# Patient Record
Sex: Female | Born: 1935 | Race: White | Hispanic: No | Smoking: Never smoker
Health system: Southern US, Community
[De-identification: ages and names within clinical notes are randomized; demographics above are authoritative.]

## PROBLEM LIST (undated history)

## (undated) DIAGNOSIS — M810 Age-related osteoporosis without current pathological fracture: Secondary | ICD-10-CM

## (undated) DIAGNOSIS — I4891 Unspecified atrial fibrillation: Secondary | ICD-10-CM

## (undated) DIAGNOSIS — G629 Polyneuropathy, unspecified: Secondary | ICD-10-CM

## (undated) DIAGNOSIS — M40209 Unspecified kyphosis, site unspecified: Secondary | ICD-10-CM

## (undated) DIAGNOSIS — C801 Malignant (primary) neoplasm, unspecified: Secondary | ICD-10-CM

## (undated) DIAGNOSIS — K219 Gastro-esophageal reflux disease without esophagitis: Secondary | ICD-10-CM

## (undated) HISTORY — PX: STOMACH SURGERY: SHX791

## (undated) HISTORY — PX: BILATERAL TOTAL MASTECTOMY WITH AXILLARY LYMPH NODE DISSECTION: SHX6364

---

## 2013-01-17 ENCOUNTER — Inpatient Hospital Stay: Payer: Self-pay | Admitting: Internal Medicine

## 2013-01-17 LAB — COMPREHENSIVE METABOLIC PANEL
ALBUMIN: 3.2 g/dL — AB (ref 3.4–5.0)
ALK PHOS: 87 U/L
Anion Gap: 5 — ABNORMAL LOW (ref 7–16)
BUN: 28 mg/dL — ABNORMAL HIGH (ref 7–18)
Bilirubin,Total: 0.4 mg/dL (ref 0.2–1.0)
CHLORIDE: 108 mmol/L — AB (ref 98–107)
CREATININE: 0.98 mg/dL (ref 0.60–1.30)
Calcium, Total: 8.6 mg/dL (ref 8.5–10.1)
Co2: 27 mmol/L (ref 21–32)
EGFR (Non-African Amer.): 52 — ABNORMAL LOW
GLUCOSE: 140 mg/dL — AB (ref 65–99)
OSMOLALITY: 287 (ref 275–301)
POTASSIUM: 3.6 mmol/L (ref 3.5–5.1)
SGOT(AST): 22 U/L (ref 15–37)
SGPT (ALT): 20 U/L (ref 12–78)
Sodium: 140 mmol/L (ref 136–145)
Total Protein: 6.9 g/dL (ref 6.4–8.2)

## 2013-01-17 LAB — URINALYSIS, COMPLETE
BLOOD: NEGATIVE
Bacteria: NONE SEEN
Bilirubin,UR: NEGATIVE
GLUCOSE, UR: NEGATIVE mg/dL (ref 0–75)
Ketone: NEGATIVE
Leukocyte Esterase: NEGATIVE
Nitrite: NEGATIVE
Ph: 5 (ref 4.5–8.0)
Protein: NEGATIVE
Specific Gravity: 1.016 (ref 1.003–1.030)
Squamous Epithelial: NONE SEEN

## 2013-01-17 LAB — CK TOTAL AND CKMB (NOT AT ARMC)
CK, TOTAL: 123 U/L (ref 21–215)
CK-MB: 2.7 ng/mL (ref 0.5–3.6)

## 2013-01-17 LAB — CBC
HCT: 37.7 % (ref 35.0–47.0)
HGB: 12.3 g/dL (ref 12.0–16.0)
MCH: 28.9 pg (ref 26.0–34.0)
MCHC: 32.7 g/dL (ref 32.0–36.0)
MCV: 88 fL (ref 80–100)
Platelet: 231 10*3/uL (ref 150–440)
RBC: 4.28 10*6/uL (ref 3.80–5.20)
RDW: 14.6 % — ABNORMAL HIGH (ref 11.5–14.5)
WBC: 11.5 10*3/uL — AB (ref 3.6–11.0)

## 2013-01-17 LAB — AMMONIA: Ammonia, Plasma: <10 mcmol/L (ref 11–32)

## 2013-01-17 LAB — LIPASE, BLOOD: Lipase: 64 U/L — ABNORMAL LOW (ref 73–393)

## 2013-01-17 LAB — RAPID INFLUENZA A&B ANTIGENS

## 2013-01-17 LAB — TROPONIN I: TROPONIN-I: 0.04 ng/mL

## 2013-01-18 LAB — BASIC METABOLIC PANEL
Anion Gap: 6 — ABNORMAL LOW (ref 7–16)
BUN: 18 mg/dL (ref 7–18)
CHLORIDE: 113 mmol/L — AB (ref 98–107)
CO2: 22 mmol/L (ref 21–32)
Calcium, Total: 8.1 mg/dL — ABNORMAL LOW (ref 8.5–10.1)
Creatinine: 0.7 mg/dL (ref 0.60–1.30)
EGFR (African American): >60
EGFR (Non-African Amer.): >60
GLUCOSE: 82 mg/dL (ref 65–99)
Osmolality: 282 (ref 275–301)
Potassium: 3.4 mmol/L — ABNORMAL LOW (ref 3.5–5.1)
Sodium: 141 mmol/L (ref 136–145)

## 2013-01-18 LAB — CBC WITH DIFFERENTIAL/PLATELET
Basophil #: 0 10*3/uL (ref 0.0–0.1)
Basophil %: 0.4 %
Eosinophil #: 0.2 10*3/uL (ref 0.0–0.7)
Eosinophil %: 2.1 %
HCT: 28.7 % — ABNORMAL LOW (ref 35.0–47.0)
HGB: 9.7 g/dL — ABNORMAL LOW (ref 12.0–16.0)
LYMPHS PCT: 26 %
Lymphocyte #: 1.9 10*3/uL (ref 1.0–3.6)
MCH: 29.4 pg (ref 26.0–34.0)
MCHC: 33.6 g/dL (ref 32.0–36.0)
MCV: 88 fL (ref 80–100)
Monocyte #: 0.9 x10 3/mm (ref 0.2–0.9)
Monocyte %: 12.6 %
NEUTROS ABS: 4.3 10*3/uL (ref 1.4–6.5)
Neutrophil %: 58.9 %
Platelet: 188 10*3/uL (ref 150–440)
RBC: 3.28 10*6/uL — ABNORMAL LOW (ref 3.80–5.20)
RDW: 15 % — AB (ref 11.5–14.5)
WBC: 7.3 10*3/uL (ref 3.6–11.0)

## 2013-01-18 LAB — WBCS, STOOL

## 2013-01-19 LAB — BASIC METABOLIC PANEL
Anion Gap: 3 — ABNORMAL LOW (ref 7–16)
BUN: 8 mg/dL (ref 7–18)
CALCIUM: 8.8 mg/dL (ref 8.5–10.1)
Chloride: 115 mmol/L — ABNORMAL HIGH (ref 98–107)
Co2: 23 mmol/L (ref 21–32)
Creatinine: 0.65 mg/dL (ref 0.60–1.30)
GLUCOSE: 101 mg/dL — AB (ref 65–99)
Osmolality: 280 (ref 275–301)
Potassium: 3.8 mmol/L (ref 3.5–5.1)
Sodium: 141 mmol/L (ref 136–145)

## 2013-01-19 LAB — MAGNESIUM: MAGNESIUM: 1.8 mg/dL

## 2013-01-20 LAB — CBC WITH DIFFERENTIAL/PLATELET
BASOS ABS: 0 10*3/uL (ref 0.0–0.1)
Basophil %: 0.6 %
Eosinophil #: 0.3 10*3/uL (ref 0.0–0.7)
Eosinophil %: 4 %
HCT: 32.1 % — AB (ref 35.0–47.0)
HGB: 10.6 g/dL — ABNORMAL LOW (ref 12.0–16.0)
LYMPHS PCT: 34.8 %
Lymphocyte #: 2.6 10*3/uL (ref 1.0–3.6)
MCH: 28.6 pg (ref 26.0–34.0)
MCHC: 33.1 g/dL (ref 32.0–36.0)
MCV: 87 fL (ref 80–100)
MONO ABS: 0.6 x10 3/mm (ref 0.2–0.9)
MONOS PCT: 8.2 %
NEUTROS ABS: 3.9 10*3/uL (ref 1.4–6.5)
Neutrophil %: 52.4 %
Platelet: 211 10*3/uL (ref 150–440)
RBC: 3.71 10*6/uL — AB (ref 3.80–5.20)
RDW: 15.1 % — ABNORMAL HIGH (ref 11.5–14.5)
WBC: 7.5 10*3/uL (ref 3.6–11.0)

## 2013-01-20 LAB — MAGNESIUM: Magnesium: 1.8 mg/dL

## 2013-01-20 LAB — POTASSIUM: Potassium: 3.7 mmol/L

## 2013-01-21 LAB — POTASSIUM: Potassium: 4 mmol/L (ref 3.5–5.1)

## 2013-01-21 LAB — MAGNESIUM: Magnesium: 2 mg/dL

## 2013-01-21 LAB — STOOL CULTURE

## 2013-01-22 LAB — CBC WITH DIFFERENTIAL/PLATELET
BASOS ABS: 0.1 10*3/uL (ref 0.0–0.1)
Basophil %: 1 %
EOS ABS: 0.5 10*3/uL (ref 0.0–0.7)
Eosinophil %: 5.2 %
HCT: 36 % (ref 35.0–47.0)
HGB: 11.8 g/dL — AB (ref 12.0–16.0)
LYMPHS ABS: 4.3 10*3/uL — AB (ref 1.0–3.6)
Lymphocyte %: 48.9 %
MCH: 28.2 pg (ref 26.0–34.0)
MCHC: 32.7 g/dL (ref 32.0–36.0)
MCV: 86 fL (ref 80–100)
MONOS PCT: 9.7 %
Monocyte #: 0.8 x10 3/mm (ref 0.2–0.9)
NEUTROS ABS: 3.1 10*3/uL (ref 1.4–6.5)
Neutrophil %: 35.2 %
Platelet: 233 10*3/uL (ref 150–440)
RBC: 4.18 10*6/uL (ref 3.80–5.20)
RDW: 14.9 % — AB (ref 11.5–14.5)
WBC: 8.7 10*3/uL (ref 3.6–11.0)

## 2013-01-22 LAB — MAGNESIUM: Magnesium: 1.7 mg/dL — ABNORMAL LOW

## 2013-01-22 LAB — POTASSIUM: POTASSIUM: 3.8 mmol/L (ref 3.5–5.1)

## 2013-01-23 LAB — CREATININE, SERUM
Creatinine: 0.76 mg/dL (ref 0.60–1.30)
EGFR (African American): >60
EGFR (Non-African Amer.): >60

## 2013-01-23 LAB — MAGNESIUM: Magnesium: 2.2 mg/dL

## 2013-01-23 LAB — POTASSIUM: Potassium: 3.9 mmol/L (ref 3.5–5.1)

## 2013-01-24 LAB — CBC WITH DIFFERENTIAL/PLATELET
Basophil #: 0.1 10*3/uL (ref 0.0–0.1)
Basophil %: 0.9 %
Eosinophil #: 0.3 10*3/uL (ref 0.0–0.7)
Eosinophil %: 3.4 %
HCT: 34 % — ABNORMAL LOW (ref 35.0–47.0)
HGB: 11.5 g/dL — ABNORMAL LOW (ref 12.0–16.0)
Lymphocyte #: 3.9 10*3/uL — ABNORMAL HIGH (ref 1.0–3.6)
Lymphocyte %: 45.4 %
MCH: 29.7 pg (ref 26.0–34.0)
MCHC: 33.8 g/dL (ref 32.0–36.0)
MCV: 88 fL (ref 80–100)
Monocyte #: 0.6 x10 3/mm (ref 0.2–0.9)
Monocyte %: 7.1 %
Neutrophil #: 3.7 10*3/uL (ref 1.4–6.5)
Neutrophil %: 43.2 %
PLATELETS: 272 10*3/uL (ref 150–440)
RBC: 3.87 10*6/uL (ref 3.80–5.20)
RDW: 14.8 % — AB (ref 11.5–14.5)
WBC: 8.5 10*3/uL (ref 3.6–11.0)

## 2013-01-24 LAB — BASIC METABOLIC PANEL
Anion Gap: 5 — ABNORMAL LOW (ref 7–16)
BUN: 10 mg/dL (ref 7–18)
CHLORIDE: 106 mmol/L (ref 98–107)
CO2: 27 mmol/L (ref 21–32)
Calcium, Total: 8.8 mg/dL (ref 8.5–10.1)
Creatinine: 0.71 mg/dL (ref 0.60–1.30)
EGFR (African American): >60
GLUCOSE: 132 mg/dL — AB (ref 65–99)
OSMOLALITY: 277 (ref 275–301)
Potassium: 3.5 mmol/L (ref 3.5–5.1)
SODIUM: 138 mmol/L (ref 136–145)

## 2013-01-24 LAB — CLOSTRIDIUM DIFFICILE(ARMC)

## 2014-04-12 ENCOUNTER — Emergency Department
Admit: 2014-04-12 | Disposition: A | Discharge status: Skilled Nursing Facility | Payer: Self-pay | Admitting: Emergency Medicine

## 2014-04-12 LAB — BASIC METABOLIC PANEL
Anion Gap: 4 — ABNORMAL LOW (ref 7–16)
BUN: 18 mg/dL
CO2: 31 mmol/L
CREATININE: 0.8 mg/dL
Calcium, Total: 8.5 mg/dL — ABNORMAL LOW
Chloride: 101 mmol/L
EGFR (Non-African Amer.): >60
Glucose: 110 mg/dL — ABNORMAL HIGH
POTASSIUM: 4.2 mmol/L
Sodium: 136 mmol/L

## 2014-04-12 LAB — CBC
HCT: 37.1 % (ref 35.0–47.0)
HGB: 11.7 g/dL — AB (ref 12.0–16.0)
MCH: 28.9 pg (ref 26.0–34.0)
MCHC: 31.7 g/dL — AB (ref 32.0–36.0)
MCV: 91 fL (ref 80–100)
Platelet: 210 10*3/uL (ref 150–440)
RBC: 4.06 10*6/uL (ref 3.80–5.20)
RDW: 15.9 % — AB (ref 11.5–14.5)
WBC: 12.5 10*3/uL — ABNORMAL HIGH (ref 3.6–11.0)

## 2014-04-12 LAB — TROPONIN I: Troponin-I: 0.03 ng/mL

## 2014-04-12 LAB — PRO B NATRIURETIC PEPTIDE: B-Type Natriuretic Peptide: 395 pg/mL — ABNORMAL HIGH

## 2014-04-17 LAB — CULTURE, BLOOD (SINGLE)

## 2014-04-28 NOTE — Discharge Summary (Signed)
ADDENDUM  PATIENT NAME:  Stephanie Mosley, Stephanie Mosley MR#:  [id number] DATE OF BIRTH:  August 02, 1924  DATE OF ADMISSION:  01/17/2013 DATE OF DISCHARGE:  01/24/2013  ADMITTING DIAGNOSIS: Diarrhea.   DISCHARGE DIAGNOSES:  1.  Diarrhea due to Clostridium difficile colitis.  2.  Electrolyte imbalances including hypokalemia and hypomagnesemia.  3.  Hypertension.  4.  Generalized weakness.  5.  History of rotator cuff tear.  6.  Basal cell cancer of the forehead.  7.  History of breast cancer.   Please refer to discharge summary done by Dr. Bobbye Charleston. The patient was admitted with nausea, vomiting, diarrhea and generalized weakness. She underwent stool study evaluation, which did confirm C. difficile. The patient was initially treated with Flagyl IV and then switched over to oral. She was aggressively hydrated. Her electrolytes were replaced. The patient was kept in the hospital. Plan was for her discharged on the 19th; however, the discharge was held due to her having frequent bowel movements due to the assisted living not being able to take her. Today, she is doing much better and diarrhea bowel movements are decreased and assisted living facility has accepted her for discharge. Please refer to the discharge summary done by Dr. Leslye Peer for further details.   DISCHARGE MEDICATIONS: Zofran 4 mg daily, Lyrica 50 two 2 times daily, diltiazem 180 daily, diazepam 5 mg 1 tab p.o. b.i.d., cimetidine 400, 1 tab p.o. b.i.d, levothyroxine 50 mcg daily, Nexium 40 daily, vitamin D3 2000 international units daily, vitamin B12 500 mcg daily, Nitro-Bid 2% 1 inch transdermal 12 hours on 12 hours off, fentanyl 50 mcg q. 48 hours, Flagyl 500 mg 1 tab p.o. q.8 hours x 9 days, KCl 10 mEq 1 tab p.o. b.i.d. x 5 days, magnesium oxide 400, 1 tab p.o. b.i.d. x 6 days.  HOME HEALTH: Yes.  PHYSICAL THERAPY AND NURSE AIDE TREATMENT: None.   HOME OXYGEN: None.   DIET: Low sodium.   ACTIVITY: As tolerated.   FOLLOWUP: With primary  M.D. in 1 to 2 weeks.  TIME SPENT: 35 minutes.  ____________________________ Lafonda Mosses Posey Pronto, MD shp:aw D: 01/25/2013 08:16:22 ET T: 01/25/2013 08:21:47 ET JOB#: [id number]  cc: Myles Tavella H. Posey Pronto, MD, <Dictator> Alric Seton MD ELECTRONICALLY SIGNED 01/27/2013 8:14

## 2014-04-28 NOTE — Discharge Summary (Signed)
PATIENT NAME:  Stephanie Mosley, Stephanie Mosley MR#:  [id number] DATE OF BIRTH:  1924/02/25  DATE OF ADMISSION:  01/17/2013 DATE OF DISCHARGE:  01/23/2013   PRIMARY CARE PHYSICIAN: At the facility.   FINAL DIAGNOSES: 1.  Clostridium difficile colitis.  2.  Hypokalemia and hypomagnesemia.  3.  Hypertension.  4.  Weakness.  5.  Rotator cuff tear.  6.  Basal cell cancer on the forehead.  7.  History of breast cancer.   MEDICATIONS ON DISCHARGE: Include Zofran 4 mg daily as needed for nausea, Lyrica 50 mg 2 capsules twice a day, diltiazem 180 mg per 24 hours 1 capsule daily, Valium 5 mg twice a day, cimetidine 400 mg twice a day, levothyroxine 500 mcg daily, Nexium 40 mg daily, vitamin D 2000 international units daily, vitamin B12 at 500 mcg daily; Nitro-Bid 2% transdermal ointment, 1 inch on 12 hours, off 12 hours; fentanyl 50 mcg/h transdermal film extended-release every 48 hours, metronidazole 50 mg 1 tablet every 8 hours for 10 more days, potassium chloride 10 mEq 1 tablet twice a day for 5 days, magnesium oxide 400 mg twice a day for 7 days.   HOME HEALTH: Yes. Physical therapy, nurse, and nurse aide to help with meds and strength.   HOME OXYGEN: No.   DIET: Low-sodium diet, regular consistency.   ACTIVITY: As tolerated.   FOLLOWUP: In 1 to 2 weeks with your doctor.   HOSPITAL COURSE: The patient was admitted January 17, 2013, discharged January 23, 2013. Came in with fall, nausea, vomiting, diarrhea. Fall was secondary to weakness. For the acute gastroenteritis, the patient was started on IV fluids and stool studies were sent off. Laboratory and radiological data during the hospital course included an EKG that showed sinus rhythm, marked sinus arrhythmia, premature atrial complexes. Stool for C. difficile was positive. White blood cell count 11.5, hemoglobin and hematocrit 12.3 and 37.7, platelet count of 231. White blood cells in the stool negative. Lipase 64. Glucose 140, BUN 28, creatinine 0.98,  sodium 140, potassium 3.6, chloride 108, CO2 of 27, calcium 8.6. Liver function tests: Normal range. Urinalysis negative. Troponin negative. Ammonia less than 10. Influenza negative A and B. CT scan of the cervical spine and head: Cervical spine showed left apical wedge-shaped 1 cm lesion, incompletely assessed on present exam; further evaluate by chest CT. CT scan of the head negative. ABG: ph2 7.41, pCO2 of 39, pO2 of 71, bicarbonate 24.7, O2 saturation 95.4. Stool culture negative. Upon discharge, white blood cell count 8.7. Creatinine 7.6, magnesium 2.2, potassium 3.9.   HOSPITAL COURSE PER PROBLEM LIST:  1.  For the patient's C. difficile colitis, the patient had a quite a bit of diarrhea. Family thinks it may have been the Norovirus initially at the facility, but the patient was positive for C. difficile. The patient was treated with Flagyl initially IV and switched over to oral. The patient had quite a few bowel movements every day, the day prior to discharge was down to 3 bowel movements,  in the morning when I saw her on January 19 only one, and it was starting to form up. The patient will be discharged back to the facility with 10 days further of treatment. 2.  For hypomagnesemia and hypokalemia, potassium and magnesium were replaced during the hospital course. Continued supplementation upon discharge.  3.  For her hypertension, she was on her usual medications.  4.  For her weakness, she would need physical therapy. Her facility stated they will take her back in  her weakened state. The patient does not want to go to another facility.  5.  For her rotator cuff on the right arm, she has very limited motion of the right shoulder.  6.  Basal cancer of the forehead: Does not want any surgery for this.  7.  History of breast cancer: She is on chronic pain medications.  8.  Hypothyroidism: She is on levothyroxine.   TIME SPENT ON DISCHARGE: 35 minutes.   ____________________________ Tana Conch.  Stephanie Peer, MD rjw:jcm D: 01/23/2013 16:23:58 ET T: 01/23/2013 17:09:48 ET JOB#: [id number]  cc: Tana Conch. Stephanie Peer, MD, <Dictator> Marisue Brooklyn MD ELECTRONICALLY SIGNED 01/28/2013 10:31

## 2014-04-28 NOTE — H&P (Signed)
PATIENT NAME:  Stephanie Mosley, ELLSWORTH MR#:  [id number] DATE OF BIRTH:  1924-07-12  DATE OF ADMISSION:  01/17/2013  ADMITTING PHYSICIAN: Gladstone Lighter, M.D.   PRIMARY CARE PHYSICIAN: Nonlocal.   CHIEF COMPLAINT: Fall, nausea, vomiting, diarrhea.   HISTORY OF PRESENT ILLNESS: Ms. Arps is an 81 year old Caucasian female with past medical history significant for early dementia, chronic angina, osteoporosis and chronic back pain, hypertension and history of breast cancer in the past, is brought from [facility] secondary to weakness, noted to have severe nausea, vomiting and diarrhea. The patient is a poor historian. She is extremely weak and not providing any history at this time, also underlying dementia. According to the report, it seems like the patient, at baseline, is confused but conversational and pushes herself around in a wheelchair. However, this morning, she was noted on the floor extremely weak. No loss of consciousness, but was found in loose stools and also bilious vomiting. She was alert and oriented an hour prior to presentation. She was brought to the ER. Her labs look okay.  Vitals are fine. She is being admitted for acute gastroenteritis causing mild mental status changes and weakness.   PAST MEDICAL HISTORY:   1.  Osteoporosis.  2.  Degenerative kyphosis.   3.  Gastroesophageal reflux disease.  4.  Healed decubitus ulcer in the back thoracic area. 5.  Left breast cancer. 6.  Hypertension. 7.  Chronic angina.  PAST SURGICAL HISTORY:   Bilateral mastectomy.   ALLERGIES: No known drug allergies.   CURRENT HOME MEDICATIONS:  1.  Zofran 4 mg 1 tablet p.o. q.a.m.  2.  Lyrica 100 mg p.o.  b.i.d.  3.  Diltiazem 180 mg p.o. at bedtime.  4.  Diazepam 5 mg p.o. b.i.d.  5.  Cimetidine 400 mg 1 tablet p.o. b.i.d.  6.  Levothyroxine 50 mcg 1 tablet p.o. daily.  7.  Nexium 40 mg p.o. daily.  8.  Colace 100 mg p.o. b.i.d.  9.  Vitamin D3, 2000 International Units daily.   10.  Vitamin B12, 500 mcg p.o. daily.  11.  Nitroglycerin patch 0.4 mg per hour 12 hours on, 12 hours off.  12.  Fentanyl patch 50 mcg q.48 hours applied to back and spine at all times except when bathing.    SOCIAL HISTORY: Has been a resident of [facility] assisted living facility for a few weeks now. No history of any smoking or alcohol use.   FAMILY HISTORY: Not known at this time.   REVIEW OF SYSTEMS: Unable to be provided secondary to the patient's dementia and confusion.   PHYSICAL EXAMINATION: VITAL SIGNS: Temperature 97.7 degrees Fahrenheit, pulse 90, respirations 16, blood pressure 128/70, pulse ox 94% on room air.  GENERAL: Well-built, well-nourished female lying in bed, not in any acute distress.  HEENT: Normocephalic, atraumatic. Pupils equal, round, reacting to light. Anicteric sclerae. Extraocular movements intact. Oropharynx clear without erythema, mass or exudates. Extremely dry mucous membranes noted. NECK: Supple. No thyromegaly, JVD or carotid bruits. No lymphadenopathy. Normal range of motion of neck without any pain.  LUNGS: Moving air bilaterally. Decreased bibasilar breath sounds.  No use of accessory muscles for breathing. No crackles or wheeze.  CARDIOVASCULAR: S1, S2, regular rate and rhythm. No murmurs, rubs or gallops.  ABDOMEN: Soft, nontender, nondistended. No hepatosplenomegaly. Normal bowel sounds.  EXTREMITIES: No pedal edema. No clubbing or cyanosis, 2+ dorsalis pedis pulses palpable bilaterally.  SKIN: No acne, rash or lesions.  LYMPHATICS: No cervical lymphadenopathy.  NEUROLOGIC:  Cranial  nerves seem to be intact. There is no facial asymmetry noted; however,  the patient not cooperating for complete neuro exam.   Her right upper extremity seems like hypertonic with upper extremity with increased tonicity and flexion deformity. The patient states it is chronic. Left upper extremity strength is 4/5 and bilateral lower extremities strength is 3/5 at  this time. Sensation seems to be intact. No neurological changes.  PSYCHOLOGICAL: The patient is awake, oriented to self at this time.   LABORATORY DATA:  ABG showing pH of 7.41, pCO2 of 39, pO2 of 71, bicarb 24.7, and sats of 95.4% on room air.   Urinalysis negative for any infection.   WBC is 7.5. Hemoglobin 12.3, hematocrit 37.7, platelet count 231.   Sodium 140, potassium 3.6, chloride 108, bicarb 27, BUN 28, creatinine 0.98, glucose of 140 and calcium of 8.6.   ALT 20, AST 22, alk phos 87, total bili 0.4 and albumin of 3.2. Lipase is within normal limits. Ammonia is less than 10. CK 123, CK-MB 2.7, troponin 0.05. Influenza test is negative. CT of the head without contrast showing no acute intracranial changes. Chronic atrophy without hydrocephalus noted. CT of the C-spine showing cervical spondylitic changes most notable on the right side at C5-C6 level, transverse ligament hypertrophy and left aical wedge shaped 1cm lesion which is incompletely assessed on this exam. Further evaluation with chest CT recommended.  EKG showing normal sinus rhythm with heart rate of 89.  No acute ST-T wave abnormalities.   ASSESSMENT AND PLAN: An 81 year old female with a history of osteoporosis, gastroesophageal reflux disease, dementia, chronic angina, hypertension, brought in from [facility] for acute gastroenteritis.  1. Fall, likely secondary to weakness. CT of the head and C-spine with no acute findings. Continue to monitor.  2.  Acute gastroenteritis, likely viral; however, we will also send stool studies at this time. She is exposed to other residents who were also having similar symptoms. IV fluids, antiemetics and conservative management.  3.  Chronic angina, continue her nitro patch    4.  Osteoporosis and chronic back pain. Hold off on the fentanyl patch, as the patient is very sleepy at this time. 5.  Deep vein thrombosis prophylaxis. Start her on Lovenox.  CODE STATUS: THE PATIENT IS A DO  NOT RESUSCITATE, AS SHE HAS ANOTHER FACILITY DNR ORDER SIGNED AND PLACED IN THE CHART.  Time spent on admission is 50 minutes.     ____________________________ Gladstone Lighter, MD rk:dmm D: 01/17/2013 11:56:51 ET T: 01/17/2013 12:40:53 ET JOB#: [id number]  cc: Gladstone Lighter, MD, <Dictator> Gladstone Lighter MD ELECTRONICALLY SIGNED 01/17/2013 14:25

## 2014-09-12 ENCOUNTER — Emergency Department: Payer: Medicare Other

## 2014-09-12 ENCOUNTER — Encounter: Payer: Self-pay | Admitting: Medical Oncology

## 2014-09-12 ENCOUNTER — Inpatient Hospital Stay
Admission: EM | Admit: 2014-09-12 | Discharge: 2014-09-13 | DRG: 947 | Disposition: A | Discharge status: Home / Self Care | Payer: Medicare Other | Attending: Internal Medicine | Admitting: Internal Medicine

## 2014-09-12 DIAGNOSIS — M810 Age-related osteoporosis without current pathological fracture: Secondary | ICD-10-CM | POA: Diagnosis not present

## 2014-09-12 DIAGNOSIS — Z9013 Acquired absence of bilateral breasts and nipples: Secondary | ICD-10-CM | POA: Diagnosis present

## 2014-09-12 DIAGNOSIS — M40209 Unspecified kyphosis, site unspecified: Secondary | ICD-10-CM | POA: Diagnosis present

## 2014-09-12 DIAGNOSIS — J189 Pneumonia, unspecified organism: Secondary | ICD-10-CM | POA: Diagnosis present

## 2014-09-12 DIAGNOSIS — Z853 Personal history of malignant neoplasm of breast: Secondary | ICD-10-CM | POA: Diagnosis not present

## 2014-09-12 DIAGNOSIS — Z66 Do not resuscitate: Secondary | ICD-10-CM | POA: Diagnosis present

## 2014-09-12 DIAGNOSIS — G629 Polyneuropathy, unspecified: Secondary | ICD-10-CM | POA: Diagnosis not present

## 2014-09-12 DIAGNOSIS — K219 Gastro-esophageal reflux disease without esophagitis: Secondary | ICD-10-CM | POA: Diagnosis present

## 2014-09-12 DIAGNOSIS — R911 Solitary pulmonary nodule: Secondary | ICD-10-CM

## 2014-09-12 DIAGNOSIS — E43 Unspecified severe protein-calorie malnutrition: Secondary | ICD-10-CM | POA: Diagnosis not present

## 2014-09-12 DIAGNOSIS — R4182 Altered mental status, unspecified: Principal | ICD-10-CM | POA: Diagnosis present

## 2014-09-12 HISTORY — DX: Unspecified kyphosis, site unspecified: M40.209

## 2014-09-12 HISTORY — DX: Malignant (primary) neoplasm, unspecified: C80.1

## 2014-09-12 HISTORY — DX: Gastro-esophageal reflux disease without esophagitis: K21.9

## 2014-09-12 HISTORY — DX: Unspecified atrial fibrillation: I48.91

## 2014-09-12 HISTORY — DX: Age-related osteoporosis without current pathological fracture: M81.0

## 2014-09-12 HISTORY — DX: Polyneuropathy, unspecified: G62.9

## 2014-09-12 LAB — COMPREHENSIVE METABOLIC PANEL
ALT: 14 U/L (ref 14–54)
ANION GAP: 5 (ref 5–15)
AST: 28 U/L (ref 15–41)
Albumin: 3.5 g/dL (ref 3.5–5.0)
Alkaline Phosphatase: 72 U/L (ref 38–126)
BILIRUBIN TOTAL: 0.5 mg/dL (ref 0.3–1.2)
BUN: 16 mg/dL (ref 6–20)
CALCIUM: 8.8 mg/dL — AB (ref 8.9–10.3)
CO2: 29 mmol/L (ref 22–32)
Chloride: 104 mmol/L (ref 101–111)
Creatinine, Ser: 0.7 mg/dL (ref 0.44–1.00)
GFR calc non Af Amer: >60 mL/min (ref >60)
Glucose, Bld: 119 mg/dL — ABNORMAL HIGH (ref 65–99)
Potassium: 4.7 mmol/L (ref 3.5–5.1)
SODIUM: 138 mmol/L (ref 135–145)
TOTAL PROTEIN: 6.4 g/dL — AB (ref 6.5–8.1)

## 2014-09-12 LAB — URINALYSIS COMPLETE WITH MICROSCOPIC (ARMC ONLY)
BILIRUBIN URINE: NEGATIVE
Bacteria, UA: NONE SEEN
Glucose, UA: NEGATIVE mg/dL
HGB URINE DIPSTICK: NEGATIVE
Ketones, ur: NEGATIVE mg/dL
LEUKOCYTES UA: NEGATIVE
NITRITE: NEGATIVE
PH: 7 (ref 5.0–8.0)
Protein, ur: NEGATIVE mg/dL
Specific Gravity, Urine: 1.014 (ref 1.005–1.030)
Squamous Epithelial / LPF: NONE SEEN

## 2014-09-12 LAB — CBC WITH DIFFERENTIAL/PLATELET
Basophils Absolute: 0.1 10*3/uL (ref 0–0.1)
Basophils Relative: 1 %
EOS PCT: 0 %
Eosinophils Absolute: 0 10*3/uL (ref 0–0.7)
HCT: 36.3 % (ref 35.0–47.0)
HEMOGLOBIN: 11.8 g/dL — AB (ref 12.0–16.0)
LYMPHS ABS: 2.3 10*3/uL (ref 1.0–3.6)
Lymphocytes Relative: 19 %
MCH: 30.4 pg (ref 26.0–34.0)
MCHC: 32.6 g/dL (ref 32.0–36.0)
MCV: 93.1 fL (ref 80.0–100.0)
Monocytes Absolute: 0.8 10*3/uL (ref 0.2–0.9)
Monocytes Relative: 7 %
NEUTROS PCT: 73 %
Neutro Abs: 8.9 10*3/uL — ABNORMAL HIGH (ref 1.4–6.5)
Platelets: 253 10*3/uL (ref 150–440)
RBC: 3.9 MIL/uL (ref 3.80–5.20)
RDW: 14.6 % — ABNORMAL HIGH (ref 11.5–14.5)
WBC: 12.2 10*3/uL — ABNORMAL HIGH (ref 3.6–11.0)

## 2014-09-12 LAB — MRSA PCR SCREENING: MRSA by PCR: NEGATIVE

## 2014-09-12 LAB — TROPONIN I: Troponin I: <0.03 ng/mL (ref <0.031)

## 2014-09-12 MED ORDER — ACETAMINOPHEN 325 MG PO TABS
650.0000 mg | ORAL_TABLET | Freq: Four times a day (QID) | ORAL | Status: DC | PRN
  Administered 2014-09-12: 650 mg via ORAL
  Filled 2014-09-12: qty 2

## 2014-09-12 MED ORDER — ONDANSETRON HCL 4 MG/2ML IJ SOLN: 4.0000 mg | Freq: Four times a day (QID) | INTRAMUSCULAR | Status: DC | PRN

## 2014-09-12 MED ORDER — BISACODYL 10 MG RE SUPP: 10.0000 mg | Freq: Every day | RECTAL | Status: DC | PRN

## 2014-09-12 MED ORDER — ACETAMINOPHEN 650 MG RE SUPP: 650.0000 mg | Freq: Four times a day (QID) | RECTAL | Status: DC | PRN

## 2014-09-12 MED ORDER — PNEUMOCOCCAL VAC POLYVALENT 25 MCG/0.5ML IJ INJ
0.5000 mL | INJECTION | INTRAMUSCULAR | Status: AC
  Administered 2014-09-13: 11:00:00 0.5 mL via INTRAMUSCULAR
  Filled 2014-09-12: qty 0.5

## 2014-09-12 MED ORDER — SODIUM CHLORIDE 0.9 % IV BOLUS (SEPSIS)
500.0000 mL | Freq: Once | INTRAVENOUS | Status: AC
  Administered 2014-09-12: 500 mL via INTRAVENOUS

## 2014-09-12 MED ORDER — VANCOMYCIN HCL 500 MG IV SOLR
500.0000 mg | INTRAVENOUS | Status: DC
  Administered 2014-09-13: 07:00:00 500 mg via INTRAVENOUS
  Filled 2014-09-12 (×2): qty 500

## 2014-09-12 MED ORDER — PIPERACILLIN-TAZOBACTAM 3.375 G IVPB
3.3750 g | Freq: Three times a day (TID) | INTRAVENOUS | Status: DC
  Administered 2014-09-13: 3.375 g via INTRAVENOUS
  Filled 2014-09-12 (×5): qty 50

## 2014-09-12 MED ORDER — VANCOMYCIN HCL IN DEXTROSE 1-5 GM/200ML-% IV SOLN: 1000.0000 mg | Freq: Once | INTRAVENOUS | Status: DC

## 2014-09-12 MED ORDER — FAMOTIDINE IN NACL 20-0.9 MG/50ML-% IV SOLN
20.0000 mg | INTRAVENOUS | Status: DC
  Administered 2014-09-12: 21:00:00 20 mg via INTRAVENOUS
  Filled 2014-09-12 (×2): qty 50

## 2014-09-12 MED ORDER — ONDANSETRON HCL 4 MG PO TABS
4.0000 mg | ORAL_TABLET | Freq: Four times a day (QID) | ORAL | Status: DC | PRN
Start: 2014-09-12 — End: 2014-09-13

## 2014-09-12 MED ORDER — VANCOMYCIN HCL 500 MG IV SOLR
500.0000 mg | Freq: Once | INTRAVENOUS | Status: AC
  Administered 2014-09-12: 500 mg via INTRAVENOUS

## 2014-09-12 MED ORDER — ENOXAPARIN SODIUM 30 MG/0.3ML ~~LOC~~ SOLN
30.0000 mg | SUBCUTANEOUS | Status: DC
  Administered 2014-09-12: 30 mg via SUBCUTANEOUS
  Filled 2014-09-12: qty 0.3

## 2014-09-12 MED ORDER — PIPERACILLIN-TAZOBACTAM 3.375 G IVPB 30 MIN
3.3750 g | Freq: Three times a day (TID) | INTRAVENOUS | Status: DC
  Administered 2014-09-12: 3.375 g via INTRAVENOUS
  Filled 2014-09-12 (×4): qty 50

## 2014-09-12 MED ORDER — VANCOMYCIN HCL 500 MG IV SOLR
500.0000 mg | INTRAVENOUS | Status: DC
  Filled 2014-09-12: qty 500

## 2014-09-12 NOTE — Progress Notes (Signed)
ANTIBIOTIC CONSULT NOTE - INITIAL  Pharmacy Consult for Vancomycin Indication: pneumonia  Allergies  Allergen Reactions  . Chocolate Palpitations    Patient Measurements: Height: 4\' 11"  (149.9 cm) Weight: 79 lb 5.8 oz (35.999 kg) IBW/kg (Calculated) : 43.2 Adjusted Body Weight: 36 kg  Vital Signs: Temp: 98 F (36.7 C) (09/07 1855) Temp Source: Oral (09/07 1855) BP: 136/63 mmHg (09/07 1855) Pulse Rate: 86 (09/07 1855) Intake/Output from previous day:   Intake/Output from this shift:    Labs:  Recent Labs  09/12/14 1024  WBC 12.2*  HGB 11.8*  PLT 253  CREATININE 0.70   Estimated Creatinine Clearance: 27.6 mL/min (by C-G formula based on Cr of 0.7). No results for input(s): VANCOTROUGH, VANCOPEAK, VANCORANDOM, GENTTROUGH, GENTPEAK, GENTRANDOM, TOBRATROUGH, TOBRAPEAK, TOBRARND, AMIKACINPEAK, AMIKACINTROU, AMIKACIN in the last 72 hours.   Microbiology: No results found for this or any previous visit (from the past 720 hour(s)).  Medical History: Past Medical History  Diagnosis Date  . Osteoporosis   . Kyphosis   . GERD (gastroesophageal reflux disease)   . Neuropathy   . A-fib     Medications:  Prescriptions prior to admission  Medication Sig Dispense Refill Last Dose  . acetaminophen (TYLENOL) 325 MG tablet Take 650 mg by mouth 3 (three) times daily.   09/12/2014 at 0800  . Cholecalciferol (VITAMIN D3) 1000 UNITS CAPS Take 2,000 Units by mouth.   09/11/2014 at 0800  . diazepam (VALIUM) 5 MG tablet Take 5 mg by mouth 2 (two) times daily.   09/11/2014 at 2000  . famotidine (PEPCID) 20 MG tablet Take 20 mg by mouth at bedtime.   09/11/2014 at 2000  . fentaNYL (DURAGESIC - DOSED MCG/HR) 50 MCG/HR Place 50 mcg onto the skin every 3 (three) days.   09/10/2014 at 2000  . levothyroxine (SYNTHROID, LEVOTHROID) 50 MCG tablet Take 50 mcg by mouth daily before breakfast.   09/11/2014  . nitroGLYCERIN (NITRODUR - DOSED IN MG/24 HR) 0.4 mg/hr patch Place 0.4 mg onto the skin daily.  Apply 1 patch topically at bedtime. Remove every morning. 12 hours on 11 hours off.   09/12/2014 at 0800  . omeprazole (PRILOSEC) 20 MG capsule Take 20 mg by mouth daily.   Past Week at Unknown time  . ondansetron (ZOFRAN) 4 MG tablet Take 4 mg by mouth every 8 (eight) hours as needed for nausea or vomiting.   09/11/2014 at 0800  . pregabalin (LYRICA) 100 MG capsule Take 100 mg by mouth 2 (two) times daily.   09/11/2014 at 2000  . Skin Protectants, Misc. (ENDIT EX) Apply 1 application topically 2 (two) times daily.   09/12/2014 at 0700  . vitamin B-12 (CYANOCOBALAMIN) 500 MCG tablet Take 500 mcg by mouth daily.   09/11/2014 at 0800  . vitamin C (ASCORBIC ACID) 500 MG tablet Take 500 mg by mouth daily.   09/10/2014 at 0800  . zinc gluconate 50 MG tablet Take 50 mg by mouth daily.   09/12/2014 at 0800   Assessment: CrCl = 27.6 ml/min Ke = 0.03 hr-1 T1/2 = 23.1 hrs Vd = 25.2 L  Goal of Therapy:  Vancomycin trough level 15-20 mcg/ml  Plan:  Expected duration 7 days with resolution of temperature and/or normalization of WBC   Vancomycin 500 mg IV X 1 given on 9/7 @ 22:00.  Vancomycin 500 mg IV Q24H ordered to start on 9/8 @ 7:00, ~ 9 hrs after 1st dose (stacked dosing). This pt will reach [facility] by 9/12 @ 22:00.  Will draw 1st trough on 9/11 @ 6:30, which will not be at [facility].   Teyla Skidgel D 09/12/2014,8:12 PM

## 2014-09-12 NOTE — Plan of Care (Signed)
Problem: Discharge Progression Outcomes Goal: Discharge plan in place and appropriate Individualization of care  Likes to be called Deneise Lever. Lives at [facility] facility. Uses wheelchair at facility. Has history of osteoporosis, kyphosis, GERD, neuropathy and a-fib, controlled by medications.

## 2014-09-12 NOTE — ED Provider Notes (Signed)
Time Seen: Approximately 10:30  I have reviewed the triage notes  Chief Complaint: Weakness   History of Present Illness: Stephanie Mosley is a 81 y.o. female who presents from a local nursing facility with stated history of altered mental status. The patient's currently awake and alert and able to answer all questions appropriately and states that she states that she's had some generalized fatigue but hasn't noticed any other physical complaints. She denies any headaches, nausea, vomiting, chest pain, abdominal pain. She denies any shortness of breath or fever. She denies any urinary or bowel complaints.   Past Medical History  Diagnosis Date  . Osteoporosis   . Kyphosis   . GERD (gastroesophageal reflux disease)   . Neuropathy   . A-fib     Patient Active Problem List   Diagnosis Date Noted  . Altered mental status 09/12/2014    Past Surgical History  Procedure Laterality Date  . Bilateral total mastectomy with axillary lymph node dissection    . Stomach surgery      Past Surgical History  Procedure Laterality Date  . Bilateral total mastectomy with axillary lymph node dissection    . Stomach surgery      Current Outpatient Rx  Name  Route  Sig  Dispense  Refill  . acetaminophen (TYLENOL) 325 MG tablet   Oral   Take 650 mg by mouth 3 (three) times daily.         . Cholecalciferol (VITAMIN D3) 1000 UNITS CAPS   Oral   Take 2,000 Units by mouth.         . diazepam (VALIUM) 5 MG tablet   Oral   Take 5 mg by mouth 2 (two) times daily.         . famotidine (PEPCID) 20 MG tablet   Oral   Take 20 mg by mouth at bedtime.         . fentaNYL (DURAGESIC - DOSED MCG/HR) 50 MCG/HR   Transdermal   Place 50 mcg onto the skin every 3 (three) days.         Marland Kitchen levothyroxine (SYNTHROID, LEVOTHROID) 50 MCG tablet   Oral   Take 50 mcg by mouth daily before breakfast.         . nitroGLYCERIN (NITRODUR - DOSED IN MG/24 HR) 0.4 mg/hr patch   Transdermal  Place 0.4 mg onto the skin daily. Apply 1 patch topically at bedtime. Remove every morning. 12 hours on 11 hours off.         . omeprazole (PRILOSEC) 20 MG capsule   Oral   Take 20 mg by mouth daily.         . ondansetron (ZOFRAN) 4 MG tablet   Oral   Take 4 mg by mouth every 8 (eight) hours as needed for nausea or vomiting.         . pregabalin (LYRICA) 100 MG capsule   Oral   Take 100 mg by mouth 2 (two) times daily.         . Skin Protectants, Misc. (ENDIT EX)   Apply externally   Apply 1 application topically 2 (two) times daily.         . vitamin B-12 (CYANOCOBALAMIN) 500 MCG tablet   Oral   Take 500 mcg by mouth daily.         . vitamin C (ASCORBIC ACID) 500 MG tablet   Oral   Take 500 mg by mouth daily.         Marland Kitchen  zinc gluconate 50 MG tablet   Oral   Take 50 mg by mouth daily.           Allergies:  Chocolate  Family History: No family history on file.  Social History: Social History  Substance Use Topics  . Smoking status: Never Smoker   . Smokeless tobacco: None  . Alcohol Use: No     Review of Systems:   10 point review of systems was performed and was otherwise negative:  Constitutional: No fever Eyes: No visual disturbances ENT: No sore throat, ear pain Cardiac: No chest pain Respiratory: No shortness of breath, wheezing, or stridor Abdomen: No abdominal pain, no vomiting, No diarrhea Endocrine: No weight loss, No night sweats Extremities: No peripheral edema, cyanosis Skin: No rashes, easy bruising Neurologic: No new focal weakness, trouble with speech or swollowing Urologic: No dysuria, Hematuria, or urinary frequency   Physical Exam:  ED Triage Vitals  Enc Vitals Group     BP 09/12/14 1013 123/52 mmHg     Pulse Rate 09/12/14 1013 69     Resp 09/12/14 1013 20     Temp 09/12/14 1013 97.5 F (36.4 C)     Temp Source 09/12/14 1013 Oral     SpO2 09/12/14 1013 96 %     Weight 09/12/14 1013 79 lb 5.8 oz (35.999 kg)      Height 09/12/14 1013 4\' 11"  (1.499 m)     Head Cir --      Peak Flow --      Pain Score 09/12/14 1028 0     Pain Loc --      Pain Edu? --      Excl. in [place]? --     General: Awake , Alert , and Oriented times 3; GCS 15 Head: Normal cephalic , atraumatic Eyes: Pupils equal , round, reactive to light Nose/Throat: No nasal drainage, patent upper airway without erythema or exudate.  Neck: Supple, Full range of motion, No anterior adenopathy or palpable thyroid masses Lungs: Clear to ascultation without wheezes , rhonchi, or rales Heart: Regular rate, regular rhythm without murmurs , gallops , or rubs Abdomen: Soft, non tender without rebound, guarding , or rigidity; bowel sounds positive and symmetric in all 4 quadrants. No organomegaly .        Extremities: 2 plus symmetric pulses. No edema, clubbing or cyanosis Neurologic: Patient's able to squeeze with both upper extremities with normal strength and no asymmetry. Patient has chronic weakness in both lower extremities and is nonambulatory Skin: warm, dry, no rashes Kyphosis  Labs:   All laboratory work was reviewed including any pertinent negatives or positives listed below:  Labs Reviewed  CBC WITH DIFFERENTIAL/PLATELET - Abnormal; Notable for the following:    WBC 12.2 (*)    Hemoglobin 11.8 (*)    RDW 14.6 (*)    Neutro Abs 8.9 (*)    All other components within normal limits  COMPREHENSIVE METABOLIC PANEL - Abnormal; Notable for the following:    Glucose, Bld 119 (*)    Calcium 8.8 (*)    Total Protein 6.4 (*)    All other components within normal limits  URINALYSIS COMPLETEWITH MICROSCOPIC ([facility] ONLY) - Abnormal; Notable for the following:    Color, Urine YELLOW (*)    APPearance CLEAR (*)    All other components within normal limits  CULTURE, BLOOD (ROUTINE X 2)  CULTURE, BLOOD (ROUTINE X 2)  URINE CULTURE  WOUND CULTURE  TROPONIN I   review of  the laboratory work shows no significant abnormalities  EKG:  ED ECG  REPORT I, Daymon Larsen, the attending physician, personally viewed and interpreted this ECG.  Date: 09/12/2014 EKG Time: 1020 Rate: 73 Rhythm: normal sinus rhythm with occasional PVCs QRS Axis: normal Intervals: normal ST/T Wave abnormalities: normal Conduction Disutrbances: none Narrative Interpretation: New downgoing deep T-wave seen in the lateral leads concerning for acute ischemia or quality EKG Left ventricular hypertrophy   Radiology:    CLINICAL DATA: Increased lethargy  EXAM: CT HEAD WITHOUT CONTRAST  TECHNIQUE: Contiguous axial images were obtained from the base of the skull through the vertex without intravenous contrast.  COMPARISON: 01/17/2013  FINDINGS: The bony calvarium is intact. Diffuse atrophic changes are noted. No findings to suggest acute hemorrhage, acute infarction or space-occupying mass lesion are noted. Scattered areas of decreased attenuation are noted consistent with chronic white matter ischemic change.  IMPRESSION: Chronic atrophic and ischemic changes without acute abnormality.   Electronically Signed By: Inez Catalina M.D. On: 09/12/2014 16:52          DG Chest Portable 1 View (Final result) Result time: 09/12/14 11:50:37   Final result by Rad Results In Interface (09/12/14 11:50:37)   Narrative:   CLINICAL DATA: Weakness. History of left breast cancer status post bilateral total mastectomies.  EXAM: PORTABLE CHEST - 1 VIEW  COMPARISON: 04/12/2014  FINDINGS: The cardiac silhouette remains mildly enlarged, unchanged. Thoracic aortic calcification and GE junction surgical clips are again seen. Areas of patchy opacity in the left upper and left mid lung do not appear significantly changed, with the left upper lung opacity possibly being at least in part related to the overlying left first rib. There is also a small amount of patchy opacity in the right mid lung which is the partly nodular in configuration and  was not clearly present on the prior study. No overt pulmonary edema, pleural effusion, or pneumothorax is identified. Degenerative changes are noted at the right shoulder with high riding humeral head compatible with chronic rotator cuff tear.  IMPRESSION: Unchanged, patchy left lung opacities with new patchy right mid lung opacity which is partly nodular. If the patient has signs of active infection, consideration could be made for follow-up chest radiographs in 3-4 weeks following treatment. However, given the nodularity of the right lung opacities and the persistence of the left lung opacities, further evaluation with immediate chest CT should be considered.     I personally reviewed the radiologic studies      Critical Care:  CRITICAL CARE Performed by: Daymon Larsen   Total critical care time: 35 minutes  Critical care time was exclusive of separately billable procedures and treating other patients.  Critical care was necessary to treat or prevent imminent or life-threatening deterioration.  Critical care was time spent personally by me on the following activities: development of treatment plan with patient and/or surrogate as well as nursing, discussions with consultants, evaluation of patient's response to treatment, examination of patient, obtaining history from patient or surrogate, ordering and performing treatments and interventions, ordering and review of laboratory studies, ordering and review of radiographic studies, pulse oximetry and re-evaluation of patient's condition. Critical care mainly revolving around workup and evaluation for altered mental status in elderly female   ED Course:  The patient was fine during my initial history and physical exam is sitting upright answering questions etc. I went back into the room to discuss the findings with the patient signed the patient was very lethargic difficult to  arouse. I was not sure the etiology these episodes  in which the son confirmed that she's been doing this now for the last approximately 48 hours where she'll be awake alert normal mental status and then suddenly drift off to where she is very difficult to arouse. No new focal deficits and the patient is able to communicate with stimulation goes back to sleep. She is afebrile and [place] no signs of sepsis etc. Her EKG shows some inverted T waves and she was noticed to have a possible new infiltrate on her chest x-ray. Patient had blood cultures 2 obtained after discussion with the hospitalist team. So went to head CT area and to evaluate for possible intracranial etiologies which was considered to be negative    Final Clinical Impression:  Final diagnoses:  Altered mental status, unspecified altered mental status type   possible pneumonia   Plan: Inpatient management Patient's case was reviewed with the hospitalist team, further disposition and management depends upon her evaluation.            Daymon Larsen, MD 09/12/14 Stephanie Mosley

## 2014-09-12 NOTE — ED Notes (Signed)
Pt to ED via ems from [facility] where pt reports that she has been feeling more tired and sleepy for the past few days. Pt reports that she will just fall asleep without warning. Pt denies pain,  A/o x 4.

## 2014-09-12 NOTE — H&P (Signed)
[facility] [place] NAME: Stephanie Mosley    MR#:  [id number]  DATE OF BIRTH:  12-02-1924  DATE OF ADMISSION:  09/12/2014  PRIMARY CARE PHYSICIAN: No primary care provider on file.   REQUESTING/REFERRING PHYSICIAN: Dr. Marcelene Butte  CHIEF COMPLAINT:  Altered mental status  HISTORY OF PRESENT ILLNESS:  Stephanie Mosley  is a 81 y.o. female with a known history of chronic atrial fibrillation, not on any anticoagulants, osteoporosis and GERD is brought in from [facility] home as patient has been not acting herself. According to the staff report patient has been feeling more tired and sleepy for the past few days. Today patient was not arousable which was really concerning and was brought into the ED. In the ED patient was initially having a conversation but eventually she fell asleep and again they were unable to arouse the patient. CT head was done which was negative. Patient is DO NOT RESUSCITATE. During my examination patient is arousable to verbal commands and  to her name but falling asleep  PAST MEDICAL HISTORY:   Past Medical History  Diagnosis Date  . Osteoporosis   . Kyphosis   . GERD (gastroesophageal reflux disease)   . Neuropathy   . A-fib     PAST SURGICAL HISTOIRY:   Past Surgical History  Procedure Laterality Date  . Bilateral total mastectomy with axillary lymph node dissection    . Stomach surgery      SOCIAL HISTORY:   Social History  Substance Use Topics  . Smoking status: Never Smoker   . Smokeless tobacco: Not on file  . Alcohol Use: No    FAMILY HISTORY:  Unobtainable as the patient is lethargic DRUG ALLERGIES:   Allergies  Allergen Reactions  . Chocolate Palpitations    REVIEW OF SYSTEMS:  Unobtainable as the patient is with altered mental status  MEDICATIONS AT HOME:   Prior to Admission medications   Medication Sig Start Date End Date Taking? Authorizing Provider  acetaminophen  (TYLENOL) 325 MG tablet Take 650 mg by mouth 3 (three) times daily.   Yes Historical Provider, MD  Cholecalciferol (VITAMIN D3) 1000 UNITS CAPS Take 2,000 Units by mouth.   Yes Historical Provider, MD  diazepam (VALIUM) 5 MG tablet Take 5 mg by mouth 2 (two) times daily.   Yes Historical Provider, MD  famotidine (PEPCID) 20 MG tablet Take 20 mg by mouth at bedtime.   Yes Historical Provider, MD  fentaNYL (DURAGESIC - DOSED MCG/HR) 50 MCG/HR Place 50 mcg onto the skin every 3 (three) days.   Yes Historical Provider, MD  levothyroxine (SYNTHROID, LEVOTHROID) 50 MCG tablet Take 50 mcg by mouth daily before breakfast.   Yes Historical Provider, MD  nitroGLYCERIN (NITRODUR - DOSED IN MG/24 HR) 0.4 mg/hr patch Place 0.4 mg onto the skin daily. Apply 1 patch topically at bedtime. Remove every morning. 12 hours on 11 hours off.   Yes Historical Provider, MD  omeprazole (PRILOSEC) 20 MG capsule Take 20 mg by mouth daily.   Yes Historical Provider, MD  ondansetron (ZOFRAN) 4 MG tablet Take 4 mg by mouth every 8 (eight) hours as needed for nausea or vomiting.   Yes Historical Provider, MD  pregabalin (LYRICA) 100 MG capsule Take 100 mg by mouth 2 (two) times daily.   Yes Historical Provider, MD  Skin Protectants, Misc. (ENDIT EX) Apply 1 application topically 2 (two) times daily.   Yes Historical Provider, MD  vitamin B-12 (CYANOCOBALAMIN) 500  MCG tablet Take 500 mcg by mouth daily.   Yes Historical Provider, MD  vitamin C (ASCORBIC ACID) 500 MG tablet Take 500 mg by mouth daily.   Yes Historical Provider, MD  zinc gluconate 50 MG tablet Take 50 mg by mouth daily.   Yes Historical Provider, MD      VITAL SIGNS:  Blood pressure 122/55, pulse 63, temperature 97.5 F (36.4 C), temperature source Oral, resp. rate 24, height 4\' 11"  (1.499 m), weight 35.999 kg (79 lb 5.8 oz), SpO2 99 %.  PHYSICAL EXAMINATION:  GENERAL:  81 y.o.-year-old patient lying in the bed with no acute distress but very lethargic EYES:  Pupils equal, round, reactive to light and accommodation. No scleral icterus.  HEENT: Head atraumatic, normocephalic.  NECK:  Supple, no jugular venous distention. No thyroid enlargement, no tenderness.  LUNGS: Crackles on the right side .moderate air entry, no wheezing, rales,rhonchi or crepitation. No use of accessory muscles of respiration.  CARDIOVASCULAR: Irregularly irregular. No murmurs, rubs, or gallops.  ABDOMEN: Soft, nontender, nondistended. Bowel sounds present. No organomegaly or mass.  EXTREMITIES: No pedal edema, cyanosis, or clubbing. Left lower leg with superficial wound  with clean dressing NEUROLOGIC: Lethargic and falling asleep PSYCHIATRIC: Lethargic, arousable to verbal commands and falling asleep SKIN: No obvious rash. Lower leg with superficial wound on the shin   LABORATORY PANEL:   CBC  Recent Labs Lab 09/12/14 1024  WBC 12.2*  HGB 11.8*  HCT 36.3  PLT 253   ------------------------------------------------------------------------------------------------------------------  Chemistries   Recent Labs Lab 09/12/14 1024  NA 138  K 4.7  CL 104  CO2 29  GLUCOSE 119*  BUN 16  CREATININE 0.70  CALCIUM 8.8*  AST 28  ALT 14  ALKPHOS 72  BILITOT 0.5   ------------------------------------------------------------------------------------------------------------------  Cardiac Enzymes  Recent Labs Lab 09/12/14 1024  TROPONINI <0.03   ------------------------------------------------------------------------------------------------------------------  RADIOLOGY:  Ct Head Wo Contrast  09/12/2014   CLINICAL DATA:  Increased lethargy  EXAM: CT HEAD WITHOUT CONTRAST  TECHNIQUE: Contiguous axial images were obtained from the base of the skull through the vertex without intravenous contrast.  COMPARISON:  01/17/2013  FINDINGS: The bony calvarium is intact. Diffuse atrophic changes are noted. No findings to suggest acute hemorrhage, acute infarction or  space-occupying mass lesion are noted. Scattered areas of decreased attenuation are noted consistent with chronic white matter ischemic change.  IMPRESSION: Chronic atrophic and ischemic changes without acute abnormality.   Electronically Signed   By: Inez Catalina M.D.   On: 09/12/2014 16:52   Dg Chest Portable 1 View  09/12/2014   CLINICAL DATA:  Weakness. History of left breast cancer status post bilateral total mastectomies.  EXAM: PORTABLE CHEST - 1 VIEW  COMPARISON:  04/12/2014  FINDINGS: The cardiac silhouette remains mildly enlarged, unchanged. Thoracic aortic calcification and GE junction surgical clips are again seen. Areas of patchy opacity in the left upper and left mid lung do not appear significantly changed, with the left upper lung opacity possibly being at least in part related to the overlying left first rib. There is also a small amount of patchy opacity in the right mid lung which is the partly nodular in configuration and was not clearly present on the prior study. No overt pulmonary edema, pleural effusion, or pneumothorax is identified. Degenerative changes are noted at the right shoulder with high riding humeral head compatible with chronic rotator cuff tear.  IMPRESSION: Unchanged, patchy left lung opacities with new patchy right mid lung opacity which is partly  nodular. If the patient has signs of active infection, consideration could be made for follow-up chest radiographs in 3-4 weeks following treatment. However, given the nodularity of the right lung opacities and the persistence of the left lung opacities, further evaluation with immediate chest CT should be considered.   Electronically Signed   By: Logan Bores M.D.   On: 09/12/2014 11:50    EKG:   Orders placed or performed during the hospital encounter of 09/12/14  . EKG 12-Lead  . EKG 12-Lead    IMPRESSION AND PLAN:  Stephanie Mosley  is a 81 y.o. female with a known history of chronic atrial fibrillation, not on any  anticoagulants, osteoporosis and GERD is brought in from [facility] home as patient has been not acting herself. According to the staff report patient has been feeling more tired and sleepy for the past few days. Today patient was not arousable which was really concerning and was brought into the ED.   1. Altered mental status secondary to right-sided pneumonia  We'll treat her for healthcare associated pneumonia as patient is from the facility With Zosyn and vancomycin Blood cultures 2, urine cultures and wound cultures were ordered Will get sputum culture and sensitivity if patient has a productive cough Provided as needed nebulizer treatments Will get neuro checks Remove her fentanyl patch until patient is more awake and alert  2. Chronic history of atrial fibrillation-rate controlled Patient is currently nothing by mouth we will hold her home medications  3. GERD Provide Pepcid IV  4. History of osteoporosis Currently patient is nothing by mouth, resume home medications when patient is more awake and alert   GI prophylaxis with Pepcid and DVT prophylaxis with Lovenox subcutaneous    All the records are reviewed and case discussed with ED provider. Family members are not available to discuss the management and care plan. Call placed CODE STATUS: DO NOT RESUSCITATE  TOTAL TIME TAKING CARE OF THIS PATIENT: 45 minutes.    Nicholes Mango M.D on 09/12/2014 at 5:42 PM  Between 7am to 6pm - Pager - [phone]  After 6pm go to [email or web address] - password [email or web address] [facility]  [place] [facility]  Office  [phone]  CC: Primary care physician; No primary care provider on file.

## 2014-09-13 ENCOUNTER — Inpatient Hospital Stay: Payer: Medicare Other

## 2014-09-13 DIAGNOSIS — R4182 Altered mental status, unspecified: Secondary | ICD-10-CM | POA: Diagnosis not present

## 2014-09-13 DIAGNOSIS — E43 Unspecified severe protein-calorie malnutrition: Secondary | ICD-10-CM | POA: Insufficient documentation

## 2014-09-13 LAB — CBC
HEMATOCRIT: 35 % (ref 35.0–47.0)
Hemoglobin: 11.6 g/dL — ABNORMAL LOW (ref 12.0–16.0)
MCH: 30.8 pg (ref 26.0–34.0)
MCHC: 33.1 g/dL (ref 32.0–36.0)
MCV: 93.2 fL (ref 80.0–100.0)
Platelets: 239 10*3/uL (ref 150–440)
RBC: 3.76 MIL/uL — ABNORMAL LOW (ref 3.80–5.20)
RDW: 14.8 % — AB (ref 11.5–14.5)
WBC: 10.1 10*3/uL (ref 3.6–11.0)

## 2014-09-13 LAB — COMPREHENSIVE METABOLIC PANEL
ALBUMIN: 3 g/dL — AB (ref 3.5–5.0)
ALT: 12 U/L — ABNORMAL LOW (ref 14–54)
AST: 17 U/L (ref 15–41)
Alkaline Phosphatase: 62 U/L (ref 38–126)
Anion gap: 8 (ref 5–15)
BUN: 14 mg/dL (ref 6–20)
CHLORIDE: 105 mmol/L (ref 101–111)
CO2: 28 mmol/L (ref 22–32)
Calcium: 8.8 mg/dL — ABNORMAL LOW (ref 8.9–10.3)
Creatinine, Ser: 0.57 mg/dL (ref 0.44–1.00)
GFR calc Af Amer: >60 mL/min (ref >60)
GFR calc non Af Amer: >60 mL/min (ref >60)
GLUCOSE: 81 mg/dL (ref 65–99)
POTASSIUM: 3.9 mmol/L (ref 3.5–5.1)
Sodium: 141 mmol/L (ref 135–145)
Total Bilirubin: 0.8 mg/dL (ref 0.3–1.2)
Total Protein: 5.2 g/dL — ABNORMAL LOW (ref 6.5–8.1)

## 2014-09-13 MED ORDER — IOHEXOL 300 MG/ML  SOLN
50.0000 mL | Freq: Once | INTRAMUSCULAR | Status: AC | PRN
  Administered 2014-09-13: 09:00:00 50 mL via INTRAVENOUS

## 2014-09-13 MED ORDER — AMOXICILLIN-POT CLAVULANATE 875-125 MG PO TABS: 1.0000 | ORAL_TABLET | Freq: Two times a day (BID) | ORAL | Status: AC

## 2014-09-13 NOTE — Plan of Care (Signed)
Problem: Discharge Progression Outcomes Goal: Other Discharge Outcomes/Goals Outcome: Progressing Pt is alert and oriented, c/o pain in left foot, resolved with tylenol. Diet on 2 g sodium, patient is alert and able to tolerate diet order. Reports feeling tingling where her wounds are on her feet. Pt is repositioned q2 hours, heels elevated. Remains incontinent of bowel and bladder.

## 2014-09-13 NOTE — Clinical Social Work Note (Signed)
Clinical Social Work Assessment  Patient Details  Name: Stephanie Mosley MRN: [id number] Date of Birth: 21-May-1924  Date of referral:  09/13/14               Reason for consult:  Facility Placement, Other (Comment Required) (From [facility] )                Permission sought to share information with:  Chartered certified accountant granted to share information::  Yes, Verbal Permission Granted  Name::      [facility]   Agency::     Relationship::     Contact Information:     Housing/Transportation Living arrangements for the past 2 months:  [place] of Information:  Patient, Adult Children Patient Interpreter Needed:  None Criminal Activity/Legal Involvement Pertinent to Current Situation/Hospitalization:  No - Comment as needed Significant Relationships:  Adult Children Lives with:  Facility Resident Do you feel safe going back to the place where you live?  Yes Need for family participation in patient care:  Yes (Comment)  Care giving concerns: Patient is a resident at [facility].    Social Worker assessment / plan: Holiday representative (CSW) received verbal consult from MD that patient is from facility and is ready for D/C today. CSW met with patient and her daughter [name] was at bedside. Patient was laying in bed and was alert and oriented. CSW introduced self and explained role of CSW department. Patient reported that she has been living at [facility] for 2 years now. Patient reported that she loves her apartment and all the pictures from her home in the apartment. Patient reported that she likes the way the sun shines in the window and a little red bird comes every morning to eat out of the bird feeder. Patient spoke [place] and is eager to return there. Daughter [name] is agreeable for patient to return there.  CSW contacted Transport planner at [facility]. Per Loma Sousa patient pays privately and can return today. Per Loma Sousa  patient is bed bound at baseline and will need EMS for transport.   Employment status:  Retired Nurse, adult PT Recommendations:  Not assessed at this time Information / Referral to community resources:  Other (Comment Required) ([place] )  Patient/Family's Response to care: Patient and daughter are agreeable for patient to return to [facility] today.   Patient/Family's Understanding of and Emotional Response to Diagnosis, Current Treatment, and Prognosis: Patient thanked CSW for visit and was pleasant throughout assessment.   Emotional Assessment Appearance:  Appears stated age Attitude/Demeanor/Rapport:    Affect (typically observed):  Accepting, Pleasant Orientation:  Oriented to Self, Oriented to Place, Oriented to  Time, Oriented to Situation Alcohol / Substance use:  Not Applicable Psych involvement (Current and /or in the community):  No (Comment)  Discharge Needs  Concerns to be addressed:  Discharge Planning Concerns Readmission within the last 30 days:  No Current discharge risk:  None Barriers to Discharge:  No Barriers Identified   Loralyn Freshwater, LCSW 09/13/2014, 2:22 PM

## 2014-09-13 NOTE — Care Management (Signed)
Dr Benjie Karvonen indicated that the son Erline Levine) and daughter Kendrick Fries) would like Hospice services at [facility]. Would like to transition from [facility] to [place].  Spoke with Erline Levine and Kendrick Fries out in the hall. Agrees with [facility] for an easier transition to Hospice, when needed.  Flo Shanks, RN representative for [facility] updated. Discharge today per Dr. Benjie Karvonen. Shelbie Ammons RN MSN Care Management [phone]

## 2014-09-13 NOTE — Progress Notes (Signed)
Patient is medically stable for D/C back to [facility] today. Per Loma Sousa RN at [place] patient can return today. Clinical Education officer, museum (CSW) prepared D/C packet and faxed D/C Summary and FL2 to [employer]. RN will arrange EMS for transport. Patient is aware of above. Patient's daughter [name] is at bedside and aware of above. RN Case Manager arranged home health through [employer]. Please reconsult if future social work needs arise. CSW signing off.   Blima Rich, [place] [phone]

## 2014-09-13 NOTE — Discharge Summary (Signed)
[facility] [place] NAME: Stephanie Mosley    MR#:  [id number]  DATE OF BIRTH:  [birth year]  DATE OF ADMISSION:  09/12/2014 ADMITTING PHYSICIAN: Nicholes Mango, MD  DATE OF DISCHARGE: 09/13/2014 PRIMARY CARE PHYSICIAN: Drs. making house calls  ADMISSION DIAGNOSIS:  Altered mental status, unspecified altered mental status type [R41.82]  DISCHARGE DIAGNOSIS:  Active Problems:   Altered mental status   Protein-calorie malnutrition, severe   SECONDARY DIAGNOSIS:   Past Medical History  Diagnosis Date  . Osteoporosis   . Kyphosis   . GERD (gastroesophageal reflux disease)   . Neuropathy   . A-fib   . Cancer     bilat mastectomy    HOSPITAL COURSE:   This is an 81 year old female with chronic atrial fibrillation who was brought in from [place] home with altered mental status. For further details is a physician P.  1. Altered mental status/acute encephalopathy: This is secondary to pneumonia. Patient was initially started on positive antibiotics including IV Zosyn and vancomycin for healthcare associated pneumonia as the patient is from facility. She is actually doing quite well and is completely at her baseline.  2. Pneumonia: Patient will be on Augmentin at discharge.  3. Metastatic lung disease: CT scan of the chest was performed which shows likely metastatic disease. Family does not want to pursue further intervention at this time. [facility] will [facility] at [facility].  4. Chronic history of atrial fibrillation: Her rate was controlled very patient is not taking anticoagulation.  5. Bilateral heel wounds thousand on admission: Wound care consult was obtained. Patient will be on Augmentin as well.  DISCHARGE CONDITIONS AND DIET:  Patient is being discharged back to [place] home in stable condition on regular diet  CONSULTS OBTAINED:     DRUG ALLERGIES:   Allergies  Allergen Reactions  .  Chocolate Palpitations    DISCHARGE MEDICATIONS:   Current Discharge Medication List    START taking these medications   Details  amoxicillin-clavulanate (AUGMENTIN) 875-125 MG per tablet Take 1 tablet by mouth 2 (two) times daily. Qty: 16 tablet, Refills: 0      CONTINUE these medications which have NOT CHANGED   Details  acetaminophen (TYLENOL) 325 MG tablet Take 650 mg by mouth 3 (three) times daily.    Cholecalciferol (VITAMIN D3) 1000 UNITS CAPS Take 2,000 Units by mouth.    diazepam (VALIUM) 5 MG tablet Take 5 mg by mouth 2 (two) times daily.    famotidine (PEPCID) 20 MG tablet Take 20 mg by mouth at bedtime.    fentaNYL (DURAGESIC - DOSED MCG/HR) 50 MCG/HR Place 50 mcg onto the skin every 3 (three) days.    levothyroxine (SYNTHROID, LEVOTHROID) 50 MCG tablet Take 50 mcg by mouth daily before breakfast.    nitroGLYCERIN (NITRODUR - DOSED IN MG/24 HR) 0.4 mg/hr patch Place 0.4 mg onto the skin daily. Apply 1 patch topically at bedtime. Remove every morning. 12 hours on 11 hours off.    omeprazole (PRILOSEC) 20 MG capsule Take 20 mg by mouth daily.    ondansetron (ZOFRAN) 4 MG tablet Take 4 mg by mouth every 8 (eight) hours as needed for nausea or vomiting.    pregabalin (LYRICA) 100 MG capsule Take 100 mg by mouth 2 (two) times daily.    Skin Protectants, Misc. (ENDIT EX) Apply 1 application topically 2 (two) times daily.    vitamin B-12 (CYANOCOBALAMIN) 500 MCG tablet Take 500 mcg  by mouth daily.    vitamin C (ASCORBIC ACID) 500 MG tablet Take 500 mg by mouth daily.    zinc gluconate 50 MG tablet Take 50 mg by mouth daily.              Today   CHIEF COMPLAINT:  Patient doing well this morning. Patient is at her baseline.   VITAL SIGNS:  Blood pressure 127/73, pulse 81, temperature 97.6 F (36.4 C), temperature source Oral, resp. rate 21, height 4\' 11"  (1.499 m), weight 35.999 kg (79 lb 5.8 oz), SpO2 95 %.   REVIEW OF SYSTEMS:  Review of Systems   Constitutional: Negative for fever, chills and malaise/fatigue.  HENT: Negative for sore throat.   Eyes: Negative for blurred vision.  Respiratory: Negative for cough, hemoptysis, shortness of breath and wheezing.   Cardiovascular: Negative for chest pain, palpitations and leg swelling.  Gastrointestinal: Negative for nausea, vomiting, abdominal pain, diarrhea and blood in stool.  Genitourinary: Negative for dysuria.  Musculoskeletal: Negative for back pain.  Skin:       Bilateral wounds on her heels  Neurological: Negative for dizziness, tremors and headaches.  Endo/Heme/Allergies: Does not bruise/bleed easily.     PHYSICAL EXAMINATION:  GENERAL:  81 y.o.-year-old patient lying in the bed with no acute distress.  NECK:  Supple, no jugular venous distention. No thyroid enlargement, no tenderness.  LUNGS: Normal breath sounds bilaterally, no wheezing, rales,rhonchi  No use of accessory muscles of respiration.  CARDIOVASCULAR: irr irr. 2/6 murmurs, NO rubs, or gallops.  ABDOMEN: Soft, non-tender, non-distended. Bowel sounds present. No organomegaly or mass.  EXTREMITIES: No pedal edema, cyanosis, or clubbing.  PSYCHIATRIC: The patient is alert and oriented x 3.  SKIN: ulcer both heels   DATA REVIEW:   CBC  Recent Labs Lab 09/13/14 0538  WBC 10.1  HGB 11.6*  HCT 35.0  PLT 239    Chemistries   Recent Labs Lab 09/13/14 0538  NA 141  K 3.9  CL 105  CO2 28  GLUCOSE 81  BUN 14  CREATININE 0.57  CALCIUM 8.8*  AST 17  ALT 12*  ALKPHOS 62  BILITOT 0.8    Cardiac Enzymes  Recent Labs Lab 09/12/14 1024  TROPONINI <0.03    Microbiology Results  @MICRORSLT48 @  RADIOLOGY:  Ct Head Wo Contrast  09/12/2014   CLINICAL DATA:  Increased lethargy  EXAM: CT HEAD WITHOUT CONTRAST  TECHNIQUE: Contiguous axial images were obtained from the base of the skull through the vertex without intravenous contrast.  COMPARISON:  01/17/2013  FINDINGS: The bony calvarium is intact.  Diffuse atrophic changes are noted. No findings to suggest acute hemorrhage, acute infarction or space-occupying mass lesion are noted. Scattered areas of decreased attenuation are noted consistent with chronic white matter ischemic change.  IMPRESSION: Chronic atrophic and ischemic changes without acute abnormality.   Electronically Signed   By: Inez Catalina M.D.   On: 09/12/2014 16:52   Ct Chest W Contrast  09/13/2014   CLINICAL DATA:  Follow-up chest x-ray. History breast cancer and bilateral mastectomy. Leukocytosis.  EXAM: CT CHEST WITH CONTRAST  TECHNIQUE: Multidetector CT imaging of the chest was performed during intravenous contrast administration.  CONTRAST:  72mL OMNIPAQUE IOHEXOL 300 MG/ML  SOLN  COMPARISON:  None.  FINDINGS: THORACIC INLET/BODY WALL:  Cachectic appearance.  History of mastectomy with no axillary or visible supraclavicular adenopathy.  MEDIASTINUM:  Normal heart size. No pericardial effusion. Extensive coronary atherosclerosis. No acute vascular abnormality. No adenopathy.  LUNG WINDOWS:  There are numerous nodular opacities, more numerous and larger at the apices. The smaller have ground-glass appearance. These have ill-defined margins without cavitation. The largest is in the upper lingula on image 27 measuring 16 mm and in the anterior segment left upper lobe measuring 38 mm. There may be cavitation with in 1 of the nodules in the right mid lung on image 20.  UPPER ABDOMEN:  Numerous clips at the epigastrium correlating with history of gastric surgery.  OSSEOUS:  T8, T9, T10, and L1 compression deformities. These appear chronic. Height loss is greatest at T8 and T9 with greater than 50% height loss.  IMPRESSION: Numerous ground-glass and solid pulmonary nodules which are primarily concerning for metastatic disease given patient's history and persistence since April 2016 chest x-ray. Multiple primary adenocarcinomas can have this appearance, although would be remarkably extensive in  this case. Indolent, atypical infection is the main nonneoplastic differential consideration (especially fungal pneumonia).   Electronically Signed   By: Monte Fantasia M.D.   On: 09/13/2014 09:50   Dg Chest Portable 1 View  09/12/2014   CLINICAL DATA:  Weakness. History of left breast cancer status post bilateral total mastectomies.  EXAM: PORTABLE CHEST - 1 VIEW  COMPARISON:  04/12/2014  FINDINGS: The cardiac silhouette remains mildly enlarged, unchanged. Thoracic aortic calcification and GE junction surgical clips are again seen. Areas of patchy opacity in the left upper and left mid lung do not appear significantly changed, with the left upper lung opacity possibly being at least in part related to the overlying left first rib. There is also a small amount of patchy opacity in the right mid lung which is the partly nodular in configuration and was not clearly present on the prior study. No overt pulmonary edema, pleural effusion, or pneumothorax is identified. Degenerative changes are noted at the right shoulder with high riding humeral head compatible with chronic rotator cuff tear.  IMPRESSION: Unchanged, patchy left lung opacities with new patchy right mid lung opacity which is partly nodular. If the patient has signs of active infection, consideration could be made for follow-up chest radiographs in 3-4 weeks following treatment. However, given the nodularity of the right lung opacities and the persistence of the left lung opacities, further evaluation with immediate chest CT should be considered.   Electronically Signed   By: Logan Bores M.D.   On: 09/12/2014 11:50      Management plans discussed with the patient and she is in agreement. Stable for discharge NH  Patient should follow up with PCP in 1 week  CODE STATUS:     Code Status Orders        Start     Ordered   09/12/14 1918  Do not attempt resuscitation (DNR)   Continuous    Question Answer Comment  In the event of cardiac or  respiratory ARREST Do not call a "code blue"   In the event of cardiac or respiratory ARREST Do not perform Intubation, CPR, defibrillation or ACLS   In the event of cardiac or respiratory ARREST Use medication by any route, position, wound care, and other measures to relive pain and suffering. May use oxygen, suction and manual treatment of airway obstruction as needed for comfort.      09/12/14 1917    Advance Directive Documentation        Most Recent Value   Type of Advance Directive  Living will   Pre-existing out of facility DNR order (yellow form or pink MOST form)     "  MOST" Form in Place?        TOTAL TIME TAKING CARE OF THIS PATIENT: 35 minutes.    Haleem Hanner M.D on 09/13/2014 at 11:28 AM  Between 7am to 6pm - Pager - [phone] After 6pm go to [email or web address] - password [email or web address] [facility]  [place] [facility]  Office  [phone]  CC: Primary care physician; No primary care provider on file.

## 2014-09-13 NOTE — Progress Notes (Signed)
Initial Nutrition Assessment  DOCUMENTATION CODES:   Severe malnutrition in context of chronic illness  INTERVENTION:  Meals and snacks: Recommend liberalizing diet to regular secondary to malnutrition. Family requesting chopped, tender meats, food preferences obtained. Agreeable to 3pm snack.   Nutritional Supplement Therapy: Family reports pt can't tolerate ensure/boost breeze, etc   NUTRITION DIAGNOSIS:   Malnutrition related to chronic illness as evidenced by severe depletion of body fat, severe depletion of muscle mass.    GOAL:   Patient will meet greater than or equal to 90% of their needs    MONITOR:    (Energy intake, Anthropometric)  REASON FOR ASSESSMENT:   Consult Assessment of nutrition requirement/status  ASSESSMENT:      Pt admitted with AMS Past Medical History  Diagnosis Date  . Osteoporosis   . Kyphosis   . GERD (gastroesophageal reflux disease)   . Neuropathy   . A-fib   . Cancer     bilat mastectomy    Current Nutrition: nothing eaten for breakfast this am  Food/Nutrition-Related History: Dtr reports decreased po intake for some time now. Has trouble chewing foods at long term care and at times does not like the meals.     Medications: dulcolax prn  Electrolyte/Renal Profile and Glucose Profile:   Recent Labs Lab 09/12/14 1024 09/13/14 0538  NA 138 141  K 4.7 3.9  CL 104 105  CO2 29 28  BUN 16 14  CREATININE 0.70 0.57  CALCIUM 8.8* 8.8*  GLUCOSE 119* 81   Protein Profile:  Recent Labs Lab 09/12/14 1024 09/13/14 0538  ALBUMIN 3.5 3.0*    Gastrointestinal Profile: Last BM: 9/08   Nutrition-Focused Physical Exam Findings: Nutrition-Focused physical exam completed. Findings are moderate to severe fat depletion, severe muscle depletion, and unable to determine edema.      Weight Change: Dtr reports UBW of 83 pounds, current wt of 79 pounds, unsure when last at 83 pounds    Diet Order:  Diet regular Room service  appropriate?: Yes; Fluid consistency:: Thin  Skin:   reviewed   Height:   Ht Readings from Last 1 Encounters:  09/12/14 4\' 11"  (1.499 m)    Weight:   Wt Readings from Last 1 Encounters:  09/12/14 79 lb 5.8 oz (35.999 kg)       BMI:  Body mass index is 16.02 kg/(m^2).  Estimated Nutritional Needs:   Kcal:  Using IBW of 45 g/kg BEE 785 kcals (IF 1.1-1.3, AF 1.2) 7262-0355 kcals/d.   Protein:  Using IBW of 45 g/kg) 1.2-1.5 g/kg 54-68 g/d  Fluid:  Using IBW of 45kg (30-69ml/kg) 1350-1535ml/d  EDUCATION NEEDS:   No education needs identified at this time  HIGH Care Level  Stephanie Mosley B. Zenia Resides, [place], [place] (pager)

## 2014-09-13 NOTE — Consult Note (Signed)
WOC wound consult note Reason for Consult:Chronic ulcers to bilateral feet.  Left lateral foot, near malleolus, bony prominence noted  and right heel. Present on admission.  Wound type:Unstageable pressure injury to left lateral foot (thin layer of slough to wound bed) Unstageable pressure injury to right heel, (scabbed wound bed) Pressure Ulcer POA: Yes Measurement:LEft lateral foot 0.5 cm x 1 cm wound bed is slough Right heel 1 cm x 1 cm scabbed nonintact lesion Wound bed:100% devitalized tissue.  Severe malnutrition noted.  Hospice care is imminent and healing of these wounds may not be possible. Will implement conservative moist wound care to promote autolysis and precent further wound decline.  WIll offload both heels with Prevalon offloading boot.  Drainage (amount, consistency, odor) Minimal serosanguinous drainage.  Periwound:intact Dressing procedure/placement/frequency:Cleanse wounds to left lateral foot and right heel with soap and water and pat gently dry.  Apply vaseline gauze to wound bed.  Cover with 4x4 gauze and secure with kerlix and tape.  Change daily.  Please apply Prevalon boots prior to discharge and send home with patient for ongoing offloading.  Will not follow at this time.  Please re-consult if needed.  Domenic Moras RN BSN [place] Pager [phone]

## 2014-09-13 NOTE — Plan of Care (Signed)
Problem: Discharge Progression Outcomes Goal: Discharge plan in place and appropriate Individualization of care  Outcome: Progressing Plan of Care Progress to Goal:  Pt is returning to [facility].  She is more alert and appears stronger than at admission yesterday.  Will be followed by hospice.  Pt also had wound consult and instructions were conveyed to ALF. Pt also had diet consult.  Pt will also have [facility] home [facility].  Pt had CT of chest. Daughter and son have been at bedside.   Goal: Pain controlled with appropriate interventions Outcome: Progressing No c/o pain.  Goal: Hemodynamically stable Outcome: Progressing VSS Goal: Other Discharge Outcomes/Goals Outcome: Progressing Pt is A&O except to time.  D/ced back to Assisted Living facility.  IV removed by [facility] nursing student.  Touched base w/med tech at facility.  EMS called for transport.

## 2014-09-13 NOTE — Progress Notes (Signed)
[facility] referral received from [place] for skilled nursing after discharge. Mrs. Stephanie Mosley is an 81 year old woman with a known history on chronic A-fib, not on anticoagulants,neuropathy, osteoporosis, bilateral mastectomy and GERD. She was brought to the ED on 9/7 from [facility], for evaluation of AMS and fatigue. Head CT negative for any acute process. Chest xray showed possible pneumonia in the right lung. She has been treated for HCAP and is to be discharged today on oral antibiotics.  Writer met with patient and her daughter Stephanie Mosley Path services explained. F2F and home health orders along with patient information faxed to referral intake. Plan is for discharge today via EMS. Thank you for the opportunity to participate in the care of this patient. Flo Shanks RN, [facility] Liaison [phone] c

## 2014-09-14 LAB — URINE CULTURE
CULTURE: NO GROWTH
Special Requests: NORMAL

## 2014-09-15 LAB — CULTURE, BLOOD (ROUTINE X 2)

## 2014-09-17 LAB — CULTURE, BLOOD (ROUTINE X 2): Culture: NO GROWTH

## 2014-09-20 LAB — WOUND CULTURE: SPECIAL REQUESTS: NORMAL

## 2015-02-11 IMAGING — CT CT HEAD WITHOUT CONTRAST
4 of 7 series · 12 of 33 positions shown, 14 images · non-contrast
Comparison: None.

CLINICAL DATA: Somnolence.  Pain.

EXAM:
CT HEAD WITHOUT CONTRAST
CT CERVICAL SPINE WITHOUT CONTRAST
TECHNIQUE: Multidetector CT imaging of the head and cervical spine was
performed following the standard protocol without intravenous
contrast. Multiplanar CT image reconstructions of the cervical spine
were also generated.

[Series 8: sag bone · sagittal · 0.24mm/px · 5 of 47 slices shown, 6 images]
[im 16/47  bone]
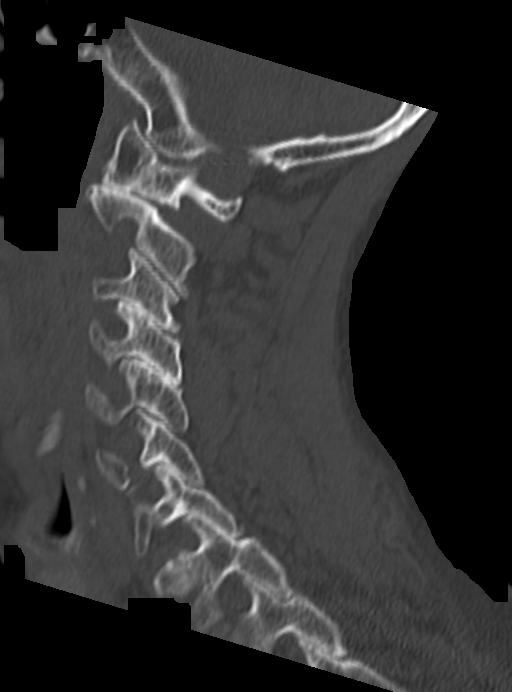
[im 20/47  bone]
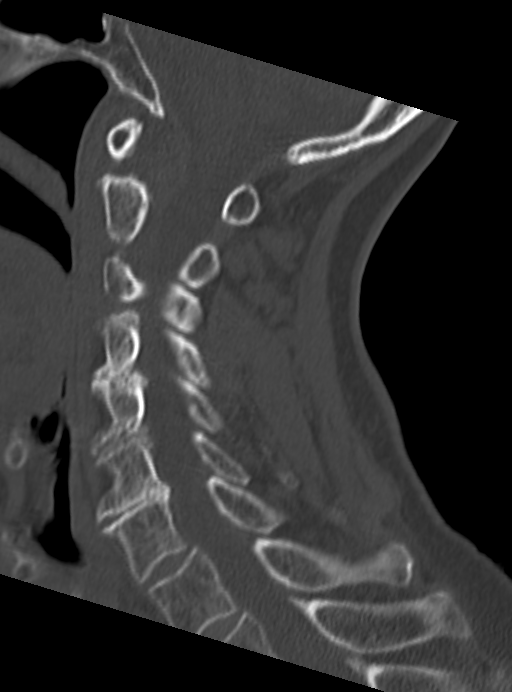
[im 24/47  soft-tissue]
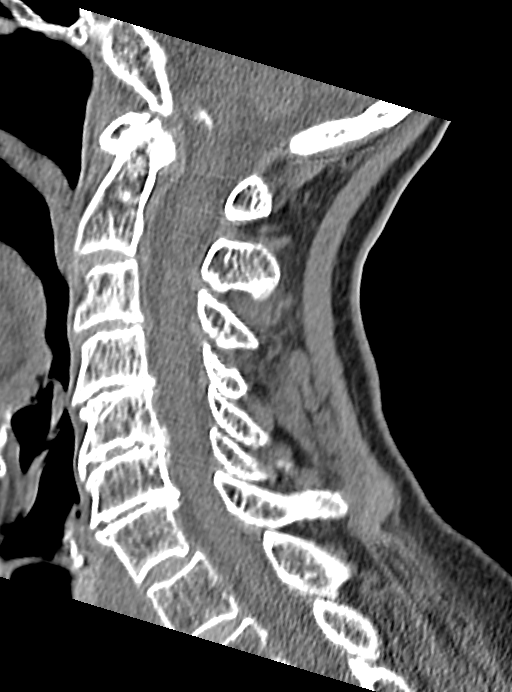
[im 24/47  bone]
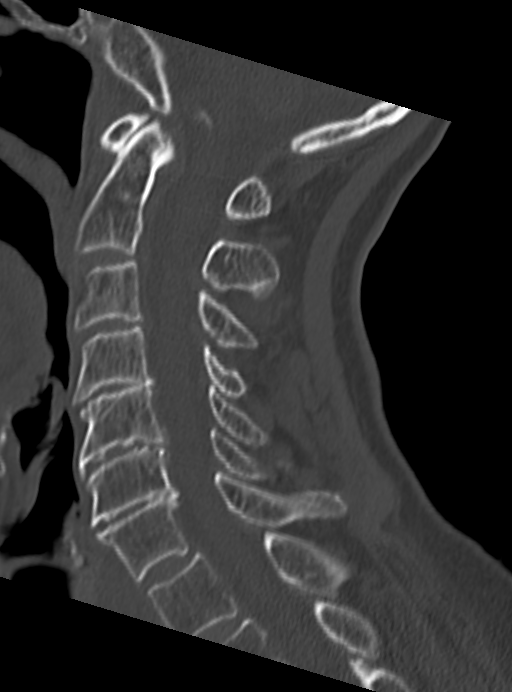
[im 27/47  bone]
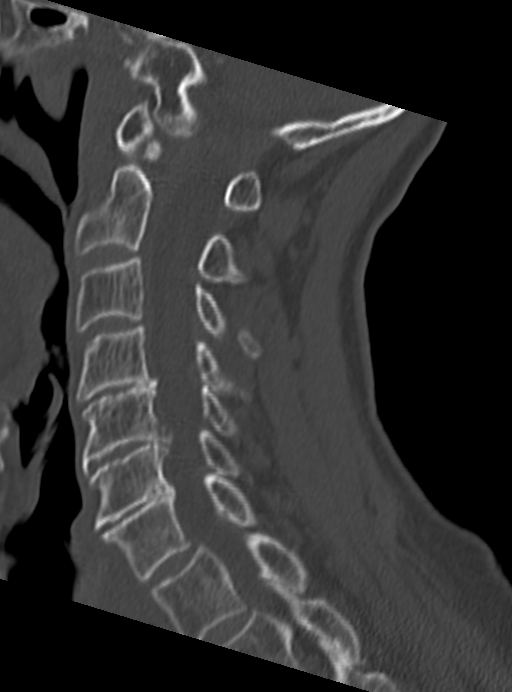
[im 31/47  bone]
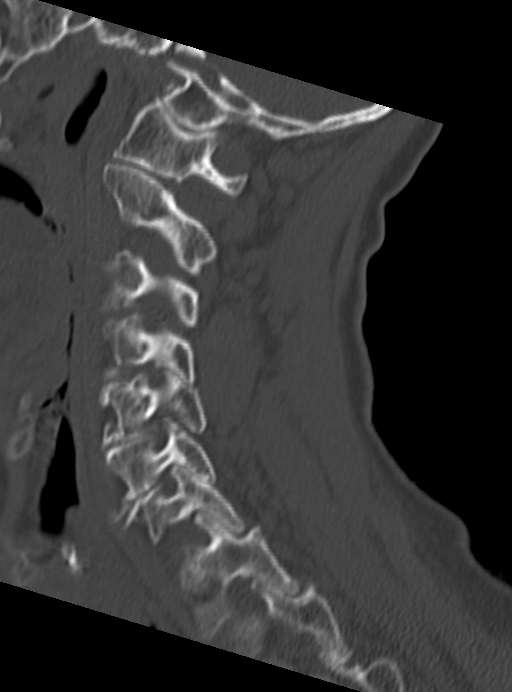

[Series 9: cor bone · coronal · 0.25mm/px · 3 of 69 slices shown]
[im 14/69  bone]
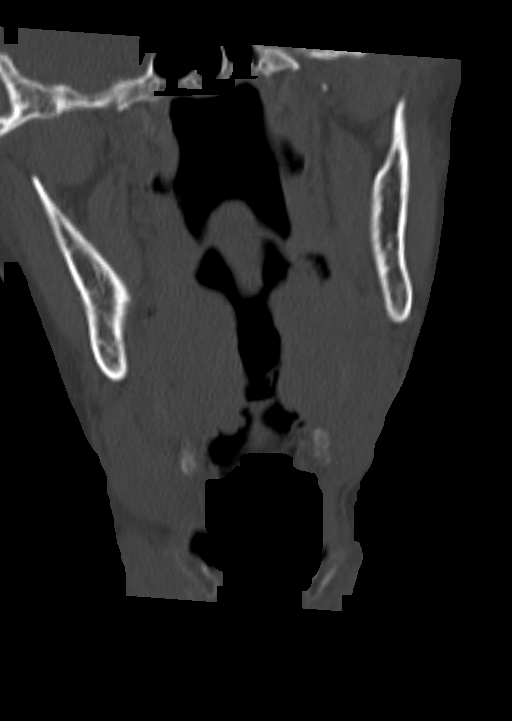
[im 28/69  bone]
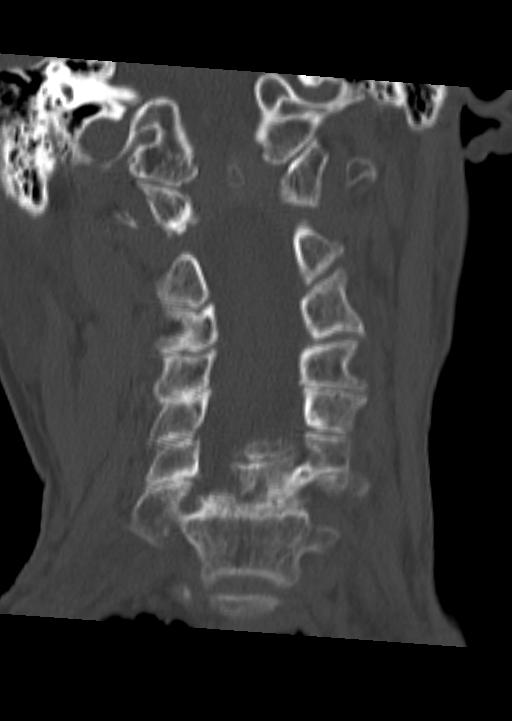
[im 41/69  bone]
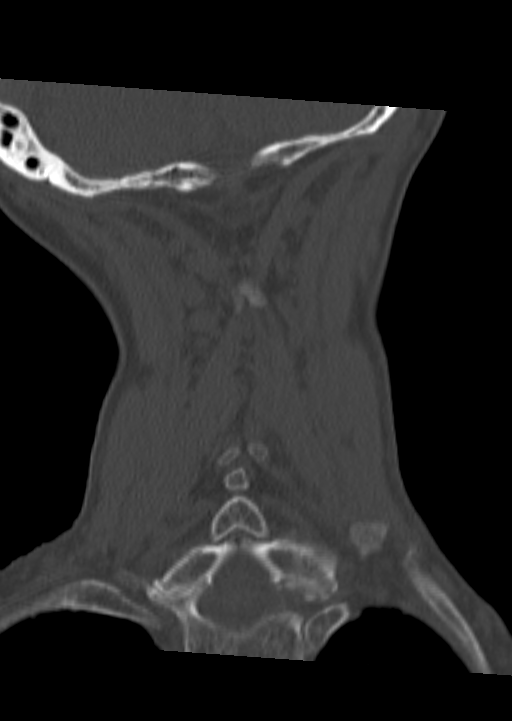

[Series 10: orthogonal axials · axial · 0.29mm/px · z∈[+321,+370]mm · 2 of 83 slices shown, 3 images]
[im 28/83  soft-tissue]
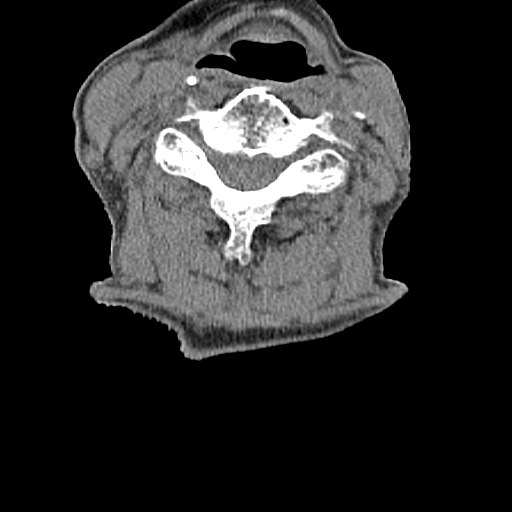
[im 28/83  bone]
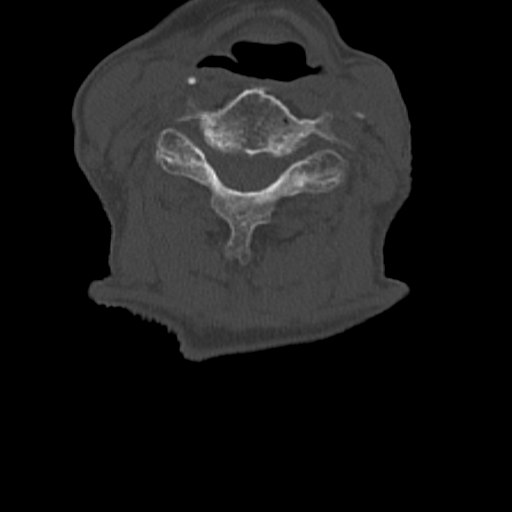
[im 55/83  bone]
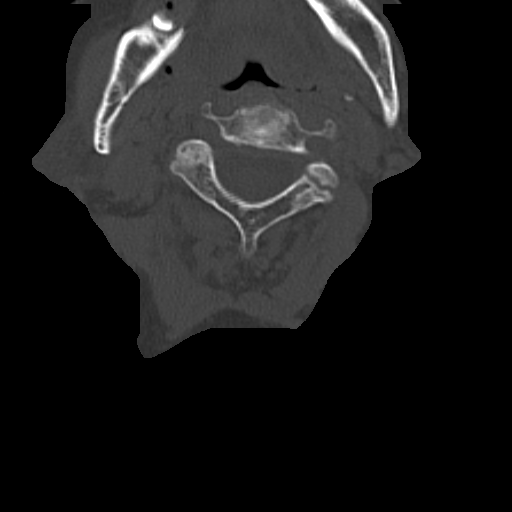

[Series 13: bone recon trauma · axial · 0.39mm/px · z∈[+433,+480]mm · 2 of 72 slices shown]
[im 24/72  bone]
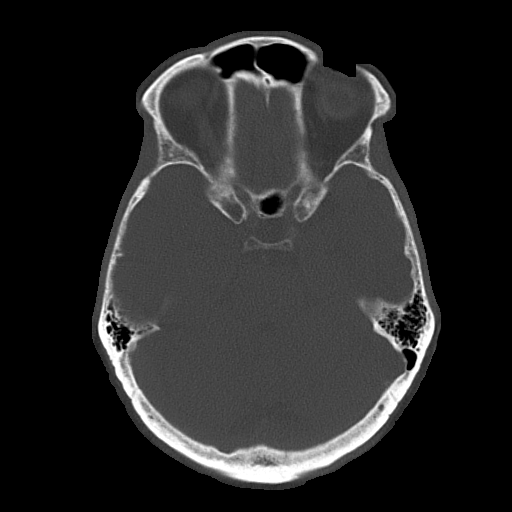
[im 48/72  bone]
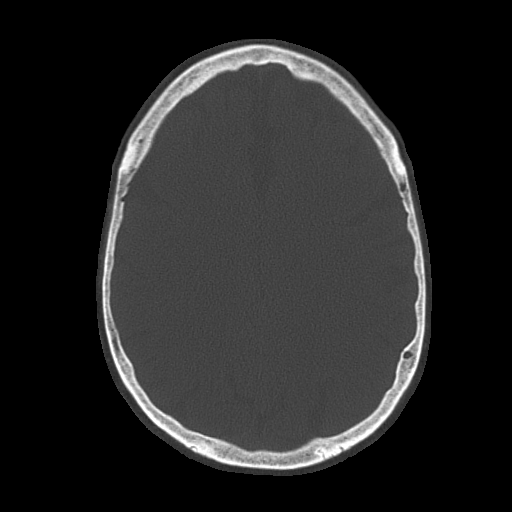

[12 of 33 positions shown; findings below may reference images not displayed]

FINDINGS: CT HEAD FINDINGS

No intracranial hemorrhage.

Small vessel disease type changes without CT evidence of large acute
infarct.

Atrophy without hydrocephalus.

No intracranial mass lesion noted on this unenhanced exam.

Vascular calcifications.

No skull fracture.

CT CERVICAL SPINE FINDINGS

No cervical spine fracture. No abnormal prevertebral soft tissue
swelling.

Head tilt to the right.

Cervical spondylotic changes most notable on the right at the C5-6
level. Transverse ligament hypertrophy.

Left apical wedge-shaped 1 cm lesion incompletely assessed on
present exam. This can be further evaluated with chest CT.

Prominent carotid bifurcation calcifications.

Caries.
IMPRESSION: Head CT:

No intracranial hemorrhage.

Small vessel disease type changes without CT evidence of large acute
infarct.

Atrophy without hydrocephalus.

Cervical spine CT:

No cervical spine fracture. No abnormal prevertebral soft tissue
swelling.

Head tilt to the right.

Cervical spondylotic changes most notable on the right at the C5-6
level. Transverse ligament hypertrophy.

Left apical wedge-shaped 1 cm lesion incompletely assessed on
present exam. This can be further evaluated with chest CT.

## 2015-05-06 DEATH — deceased
